# Patient Record
Sex: Male | Born: 1990 | Race: Black or African American | Hispanic: No | Marital: Single | Smoking: Never smoker
Health system: Southern US, Community
[De-identification: ages and names within clinical notes are randomized; demographics above are authoritative.]

## PROBLEM LIST (undated history)

## (undated) DIAGNOSIS — T883XXA Malignant hyperthermia due to anesthesia, initial encounter: Secondary | ICD-10-CM

## (undated) HISTORY — PX: HUMERUS FRACTURE SURGERY: SHX670

---
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
## 2012-03-16 DIAGNOSIS — T883XXA Malignant hyperthermia due to anesthesia, initial encounter: Secondary | ICD-10-CM

## 2012-03-16 HISTORY — PX: HUMERUS FRACTURE SURGERY: SHX670

## 2012-03-16 HISTORY — DX: Malignant hyperthermia due to anesthesia, initial encounter: T88.3XXA

## 2013-02-07 ENCOUNTER — Ambulatory Visit: Payer: Self-pay | Admitting: Family Medicine

## 2014-05-25 ENCOUNTER — Ambulatory Visit: Payer: Self-pay | Admitting: Family Medicine

## 2014-08-21 ENCOUNTER — Encounter: Payer: Self-pay | Admitting: Family Medicine

## 2014-09-05 ENCOUNTER — Ambulatory Visit (INDEPENDENT_AMBULATORY_CARE_PROVIDER_SITE_OTHER): Payer: BLUE CROSS/BLUE SHIELD | Admitting: Family Medicine

## 2014-09-05 ENCOUNTER — Encounter: Payer: Self-pay | Admitting: *Deleted

## 2014-09-05 ENCOUNTER — Encounter: Payer: Self-pay | Admitting: Family Medicine

## 2014-09-05 VITALS — BP 116/70 | HR 84 | Temp 98.3°F | Resp 17 | Ht 74.0 in | Wt 168.8 lb

## 2014-09-05 DIAGNOSIS — Z0000 Encounter for general adult medical examination without abnormal findings: Secondary | ICD-10-CM

## 2014-09-05 DIAGNOSIS — K4090 Unilateral inguinal hernia, without obstruction or gangrene, not specified as recurrent: Secondary | ICD-10-CM

## 2014-09-05 NOTE — Progress Notes (Signed)
Name: Albert Gonzales   MRN: [id number]    DOB: September 07, 1990   Date:09/05/2014       Progress Note  Subjective  Chief Complaint  Chief Complaint  Patient presents with  . Annual Exam    HPI  Patient is here today for a Complete Male Physical Exam:  The patient has no acute concerns other than ongoing left groin and scrotal mass. Overall feels healthy. Diet is well balanced. In general does exercise regularly. Sees dentist regularly and addresses vision concerns with ophthalmologist if applicable. In regards to sexual activity the patient is currently sexually active. Currently is not concerned about exposure to any STDs.    History reviewed. No pertinent past medical history.  Past Surgical History  Procedure Laterality Date  . Mandible surgery Left 2006  . Femur fracture surgery Right 2014    Family History  Problem Relation Age of Onset  . Family history unknown: Yes    History   Social History  . Marital Status: Single    Spouse Name: N/A  . Number of Children: N/A  . Years of Education: N/A   Occupational History  . Not on file.   Social History Main Topics  . Smoking status: Never Smoker   . Smokeless tobacco: Not on file  . Alcohol Use: 0.6 oz/week    1 Cans of beer per week     Comment: Patient states he may drink a beer once a month  . Drug Use: No  . Sexual Activity:    Partners: Female    Pharmacist, hospital Protection: Condom   Other Topics Concern  . Not on file   Social History Narrative  . No narrative on file    No current outpatient prescriptions on file.  No Known Allergies  ROS  CONSTITUTIONAL: No significant weight changes, fever, chills, weakness or fatigue.  HEENT:  - Eyes: No visual changes.  - Ears: No auditory changes. No pain.  - Nose: No sneezing, congestion, runny nose. - Throat: No sore throat. No changes in swallowing. SKIN: No rash or itching.  CARDIOVASCULAR: No chest pain, chest pressure or chest discomfort. No  palpitations or edema.  RESPIRATORY: No shortness of breath, cough or sputum.  GASTROINTESTINAL: No anorexia, nausea, vomiting. No changes in bowel habits. No abdominal pain or blood.  GENITOURINARY: No dysuria. No frequency. No discharge.  NEUROLOGICAL: No headache, dizziness, syncope, paralysis, ataxia, numbness or tingling in the extremities. No memory changes. No change in bowel or bladder control.  MUSCULOSKELETAL: No joint pain. No muscle pain. HEMATOLOGIC: No anemia, bleeding or bruising.  LYMPHATICS: No enlarged lymph nodes.  PSYCHIATRIC: No change in mood. No change in sleep pattern.  ENDOCRINOLOGIC: No reports of sweating, cold or heat intolerance. No polyuria or polydipsia.   Objective  Filed Vitals:   09/05/14 1150  BP: 116/70  Pulse: 84  Temp: 98.3 F (36.8 C)  Resp: 17  Height: 6\' 2"  (1.88 m)  Weight: 168 lb 12.8 oz (76.567 kg)  SpO2: 96%    Depression screen PHQ 2/9 09/05/2014  Decreased Interest 0  Down, Depressed, Hopeless 0  PHQ - 2 Score 0      Physical Exam  Constitutional: Patient appears well-developed and well-nourished. In no distress.  HEENT:  - Head: Normocephalic and atraumatic.  - Ears: Bilateral TMs gray, no erythema or effusion - Nose: Nasal mucosa moist - Mouth/Throat: Oropharynx is clear and moist. No tonsillar hypertrophy or erythema. No post nasal drainage.  - Eyes: Conjunctivae clear,  EOM movements normal. PERRLA. No scleral icterus.  Neck: Normal range of motion. Neck supple. No JVD present. No thyromegaly present.  Cardiovascular: Normal rate, regular rhythm and normal heart sounds.  No murmur heard.  Pulmonary/Chest: Effort normal and breath sounds normal. No respiratory distress. Abdominal: Soft. Bowel sounds are normal, no distension. There is no tenderness. no masses BREAST: Bilateral breast exam normal with no masses, skin changes or nipple discharge MALE GENITALIA: Bilateral testes descended with no penile lesions, no penile  discharge. Left scrotum full with palpable mass that is reducible. Non tender. Musculoskeletal: Normal range of motion bilateral UE and LE, no joint effusions. Peripheral vascular: Bilateral LE no edema. Neurological: CN II-XII grossly intact with no focal deficits. Alert and oriented to person, place, and time. Coordination, balance, strength, speech and gait are normal.  Skin: Skin is warm and dry. No rash noted. No erythema.  Psychiatric: Patient has a normal mood and affect. Behavior is normal in office today. Judgment and thought content normal in office today.    Assessment & Plan  1. Annual physical exam Health young male. Going to school, Physics major. Good extra carricular activities and exercise. Working as well. He has declined any concerns for STDs or routine blood work.  2. Direct inguinal hernia Clinically suspicious findings of left inguinal hernia without torsion or strangulation. Will have him consult with Gen Surg.  - Ambulatory referral to General Surgery

## 2014-09-05 NOTE — Patient Instructions (Signed)

## 2014-09-19 ENCOUNTER — Ambulatory Visit (INDEPENDENT_AMBULATORY_CARE_PROVIDER_SITE_OTHER): Payer: BLUE CROSS/BLUE SHIELD | Admitting: General Surgery

## 2014-09-19 ENCOUNTER — Encounter: Payer: Self-pay | Admitting: General Surgery

## 2014-09-19 VITALS — BP 108/60 | HR 71 | Resp 13 | Ht 74.0 in | Wt 166.0 lb

## 2014-09-19 DIAGNOSIS — K4090 Unilateral inguinal hernia, without obstruction or gangrene, not specified as recurrent: Secondary | ICD-10-CM

## 2014-09-19 NOTE — Progress Notes (Signed)
Patient ID: Albert Gonzales, male   DOB: Nov 21, 1990, 24 y.o.   MRN: [id number]  Chief Complaint  Patient presents with  . Inguinal Hernia    HPI Albert Gonzales is a 24 y.o. male.  Here today for evaluation of possible inguinal hernia. He states that he developed a knot in his left lower abdomen about 2 years ago. He reports that the area seemed to move lower and is now in his lower left groin area and he does feel a mass in his scrotum. He reports that the pain comes and goes and occurs about twice a week. He reports that he pain has increased in the last year. He reports no problems with using the bathroom. No history of hernia.    HPI  Past Medical History  Diagnosis Date  . Hypothermia following anesthesia     malignant hypothermia    Past Surgical History  Procedure Laterality Date  . Mandible surgery Left 2006  . Femur fracture surgery Right 2014    Family History  Problem Relation Age of Onset  . Family history unknown: Yes    Social History History  Substance Use Topics  . Smoking status: Never Smoker   . Smokeless tobacco: Never Used  . Alcohol Use: 0.6 oz/week    1 Cans of beer per week     Comment: Patient states he may drink a beer once a month    No Known Allergies  No current outpatient prescriptions on file.   No current facility-administered medications for this visit.    Review of Systems Review of Systems  Constitutional: Negative.   Respiratory: Negative.   Cardiovascular: Negative.   Gastrointestinal: Negative.     Blood pressure 108/60, pulse 71, resp. rate 13, height 6\' 2"  (1.88 m), weight 166 lb (75.297 kg).  Physical Exam Physical Exam  Constitutional: He is oriented to person, place, and time. He appears well-developed and well-nourished.  Eyes: Conjunctivae are normal. No scleral icterus.  Neck: Neck supple.  Cardiovascular: Normal rate, regular rhythm and normal heart sounds.   Pulmonary/Chest: Effort normal and  breath sounds normal.  Abdominal: Soft. Normal appearance. There is no tenderness. A hernia is present. Hernia confirmed positive in the left inguinal area (reducible).  Genitourinary: Left testis shows swelling.  Lymphadenopathy:    He has no cervical adenopathy.  Neurological: He is alert and oriented to person, place, and time.  Skin: Skin is warm and dry.  Psychiatric: He has a normal mood and affect.  Hernis descends into scrotum.  Data Reviewed    Assessment    Left inguinal hernia, reducible     Plan    Hernia precautions and incarceration were discussed with the patient. If they develop symptoms of an incarcerated hernia, they were encouraged to seek prompt medical attention.  I have recommended repair of the hernia using mesh on an outpatient basis in the near future. The risk of infection was reviewed. The role of prosthetic mesh to minimize the risk of recurrence was reviewed. Patient agreeable.    Patient[facility].     Follow up tba PCP:  Enid BaasSundaram, Ashany  Kennedy, Caryl-Lyn M 09/19/2014, 4:06 PM

## 2014-09-19 NOTE — Patient Instructions (Addendum)
The patient is aware to call back for any questions or concerns.Inguinal Hernia, Adult Muscles help keep everything in the body in its proper place. But if a weak spot in the muscles develops, something can poke through. That is called a hernia. When this happens in the lower part of the belly (abdomen), it is called an inguinal hernia. (It takes its name from a part of the body in this region called the inguinal canal.) A weak spot in the wall of muscles lets some fat or part of the small intestine bulge through. An inguinal hernia can develop at any age. Men get them more often than women. CAUSES  In adults, an inguinal hernia develops over time.  It can be triggered by:  Suddenly straining the muscles of the lower abdomen.  Lifting heavy objects.  Straining to have a bowel movement. Difficult bowel movements (constipation) can lead to this.  Constant coughing. This may be caused by smoking or lung disease.  Being overweight.  Being pregnant.  Working at a job that requires long periods of standing or heavy lifting.  Having had an inguinal hernia before. One type can be an emergency situation. It is called a strangulated inguinal hernia. It develops if part of the small intestine slips through the weak spot and cannot get back into the abdomen. The blood supply can be cut off. If that happens, part of the intestine may die. This situation requires emergency surgery. SYMPTOMS  Often, a small inguinal hernia has no symptoms. It is found when a healthcare provider does a physical exam. Larger hernias usually have symptoms.   In adults, symptoms may include:  A lump in the groin. This is easier to see when the person is standing. It might disappear when lying down.  In men, a lump in the scrotum.  Pain or burning in the groin. This occurs especially when lifting, straining or coughing.  A dull ache or feeling of pressure in the groin.  Signs of a strangulated hernia can  include:  A bulge in the groin that becomes very painful and tender to the touch.  A bulge that turns red or purple.  Fever, nausea and vomiting.  Inability to have a bowel movement or to pass gas. DIAGNOSIS  To decide if you have an inguinal hernia, a healthcare provider will probably do a physical examination.  This will include asking questions about any symptoms you have noticed.  The healthcare provider might feel the groin area and ask you to cough. If an inguinal hernia is felt, the healthcare provider may try to slide it back into the abdomen.  Usually no other tests are needed. TREATMENT  Treatments can vary. The size of the hernia makes a difference. Options include:  Watchful waiting. This is often suggested if the hernia is small and you have had no symptoms.  No medical procedure will be done unless symptoms develop.  You will need to watch closely for symptoms. If any occur, contact your healthcare provider right away.  Surgery. This is used if the hernia is larger or you have symptoms.  Open surgery. This is usually an outpatient procedure (you will not stay overnight in a hospital). An cut (incision) is made through the skin in the groin. The hernia is put back inside the abdomen. The weak area in the muscles is then repaired by herniorrhaphy or hernioplasty. Herniorrhaphy: in this type of surgery, the weak muscles are sewn back together. Hernioplasty: a patch or mesh is used  to close the weak area in the abdominal wall.  Laparoscopy. In this procedure, a surgeon makes small incisions. A thin tube with a tiny video camera (called a laparoscope) is put into the abdomen. The surgeon repairs the hernia with mesh by looking with the video camera and using two long instruments. HOME CARE INSTRUCTIONS   After surgery to repair an inguinal hernia:  You will need to take pain medicine prescribed by your healthcare provider. Follow all directions carefully.  You will need  to take care of the wound from the incision.  Your activity will be restricted for awhile. This will probably include no heavy lifting for several weeks. You also should not do anything too active for a few weeks. When you can return to work will depend on the type of job that you have.  During "watchful waiting" periods, you should:  Maintain a healthy weight.  Eat a diet high in fiber (fruits, vegetables and whole grains).  Drink plenty of fluids to avoid constipation. This means drinking enough water and other liquids to keep your urine clear or pale yellow.  Do not lift heavy objects.  Do not stand for long periods of time.  Quit smoking. This should keep you from developing a frequent cough. SEEK MEDICAL CARE IF:   A bulge develops in your groin area.  You feel pain, a burning sensation or pressure in the groin. This might be worse if you are lifting or straining.  You develop a fever of more than 100.5 F (38.1 C). SEEK IMMEDIATE MEDICAL CARE IF:   Pain in the groin increases suddenly.  A bulge in the groin gets bigger suddenly and does not go down.  For men, there is sudden pain in the scrotum. Or, the size of the scrotum increases.  A bulge in the groin area becomes red or purple and is painful to touch.  You have nausea or vomiting that does not go away.  You feel your heart beating much faster than normal.  You cannot have a bowel movement or pass gas.  You develop a fever of more than 102.0 F (38.9 C). Document Released: 07/19/2008 Document Revised: 05/25/2011 Document Reviewed: 07/19/2008 [facility] Patient Information 2015 [place], [place]. This information is not intended to replace advice given to you by your health care provider. Make sure you discuss any questions you have with your health care provider.  Patient's surgery has been scheduled for 09-26-14 at [facility].

## 2014-09-19 NOTE — Addendum Note (Signed)
Addended by: Kieth BrightlySANKAR, Shoni Quijas G on: 09/19/2014 06:19 PM   Modules accepted: Orders

## 2014-09-20 ENCOUNTER — Encounter: Payer: Self-pay | Admitting: General Surgery

## 2014-09-24 ENCOUNTER — Inpatient Hospital Stay: Admission: RE | Admit: 2014-09-24 | Payer: Self-pay | Source: Ambulatory Visit

## 2014-09-25 ENCOUNTER — Encounter: Payer: Self-pay | Admitting: *Deleted

## 2014-09-25 DIAGNOSIS — K4090 Unilateral inguinal hernia, without obstruction or gangrene, not specified as recurrent: Secondary | ICD-10-CM | POA: Diagnosis not present

## 2014-09-25 DIAGNOSIS — Z87898 Personal history of other specified conditions: Secondary | ICD-10-CM | POA: Diagnosis not present

## 2014-09-25 MED ORDER — CEFAZOLIN SODIUM-DEXTROSE 2-3 GM-% IV SOLR
2.0000 g | INTRAVENOUS | Status: AC
  Administered 2014-09-26: 2 g via INTRAVENOUS

## 2014-09-25 NOTE — Patient Instructions (Signed)
  Your procedure is scheduled on: 09-26-14 Report to [facility] 2ND FLOOR To find out your arrival time please call [phone] between 1PM - 3PM on 09-25-14   Remember: Instructions that are not followed completely may result in serious medical risk, up to and including death, or upon the discretion of your surgeon and anesthesiologist your surgery may need to be rescheduled.    _X___ 1. Do not eat food or drink liquids after midnight. No gum chewing or hard candies.     _X___ 2. No Alcohol for 24 hours before or after surgery.   ____ 3. Bring all medications with you on the day of surgery if instructed.    _X___ 4. Notify your doctor if there is any change in your medical condition     (cold, fever, infections).     Do not wear jewelry, make-up, hairpins, clips or nail polish.  Do not wear lotions, powders, or perfumes. You may wear deodorant.  Do not shave 48 hours prior to surgery. Men may shave face and neck.  Do not bring valuables to the hospital.    [facility] not responsible for any belongings or valuables.               Contacts, dentures or bridgework may not be worn into surgery.  Leave your suitcase in the car. After surgery it may be brought to your room.  For patients admitted to the hospital, discharge time is determined by your  treatment team.   Patients discharged the day of surgery will not be allowed to drive home.   Please read over the following fact sheets that you were given:     ____ Take these medicines the morning of surgery with A SIP OF WATER:    1. NONE  2.   3.   4.  5.  6.  ____ Fleet Enema (as directed)   _X___ Use CHG Soap as directed  ____ Use inhalers on the day of surgery  ____ Stop metformin 2 days prior to surgery    ____ Take 1/2 of usual insulin dose the night before surgery and none on the morning of surgery.   ____ Stop Coumadin/Plavix/aspirin-N/A  ____ Stop Anti-inflammatories-NO NSAIDS OR  ASPIRIN PRODUCTS-TYLENOL OK   ____ Stop supplements until after surgery.    ____ Bring C-Pap to the hospital.

## 2014-09-26 ENCOUNTER — Ambulatory Visit: Payer: BLUE CROSS/BLUE SHIELD | Admitting: Anesthesiology

## 2014-09-26 ENCOUNTER — Encounter: Payer: Self-pay | Admitting: General Surgery

## 2014-09-26 ENCOUNTER — Encounter
Admission: RE | Disposition: A | Discharge status: Home / Self Care | Payer: Self-pay | Source: Ambulatory Visit | Attending: General Surgery

## 2014-09-26 ENCOUNTER — Ambulatory Visit
Admission: RE | Admit: 2014-09-26 | Discharge: 2014-09-26 | Disposition: A | Discharge status: Home / Self Care | Payer: BLUE CROSS/BLUE SHIELD | Source: Ambulatory Visit | Attending: General Surgery | Admitting: General Surgery

## 2014-09-26 DIAGNOSIS — Z87898 Personal history of other specified conditions: Secondary | ICD-10-CM | POA: Insufficient documentation

## 2014-09-26 DIAGNOSIS — K4090 Unilateral inguinal hernia, without obstruction or gangrene, not specified as recurrent: Secondary | ICD-10-CM

## 2014-09-26 HISTORY — PX: INGUINAL HERNIA REPAIR: SHX194

## 2014-09-26 HISTORY — DX: Malignant hyperthermia due to anesthesia, initial encounter: T88.3XXA

## 2014-09-26 SURGERY — REPAIR, HERNIA, INGUINAL, ADULT
Anesthesia: General | Laterality: Left | Wound class: Clean

## 2014-09-26 MED ORDER — FAMOTIDINE 20 MG PO TABS
ORAL_TABLET | ORAL | Status: AC
  Filled 2014-09-26: qty 1

## 2014-09-26 MED ORDER — LIDOCAINE HCL (PF) 1 % IJ SOLN
INTRAMUSCULAR | Status: AC
  Filled 2014-09-26: qty 30

## 2014-09-26 MED ORDER — MIDAZOLAM HCL 2 MG/2ML IJ SOLN
INTRAMUSCULAR | Status: DC | PRN
  Administered 2014-09-26: 2 mg via INTRAVENOUS

## 2014-09-26 MED ORDER — OXYCODONE-ACETAMINOPHEN 5-325 MG PO TABS
ORAL_TABLET | ORAL | Status: AC
  Filled 2014-09-26: qty 1

## 2014-09-26 MED ORDER — PROPOFOL INFUSION 10 MG/ML OPTIME
INTRAVENOUS | Status: DC | PRN
  Administered 2014-09-26: 100 ug/kg/min via INTRAVENOUS

## 2014-09-26 MED ORDER — ACETAMINOPHEN 10 MG/ML IV SOLN
INTRAVENOUS | Status: AC
  Filled 2014-09-26: qty 100

## 2014-09-26 MED ORDER — OXYCODONE-ACETAMINOPHEN 5-325 MG PO TABS: 1.0000 | ORAL_TABLET | ORAL | Status: DC | PRN

## 2014-09-26 MED ORDER — ONDANSETRON HCL 4 MG/2ML IJ SOLN
INTRAMUSCULAR | Status: DC | PRN
  Administered 2014-09-26: 4 mg via INTRAVENOUS

## 2014-09-26 MED ORDER — ONDANSETRON HCL 4 MG/2ML IJ SOLN
4.0000 mg | Freq: Once | INTRAMUSCULAR | Status: DC | PRN
Start: 2014-09-26 — End: 2014-09-26

## 2014-09-26 MED ORDER — CEFAZOLIN SODIUM-DEXTROSE 2-3 GM-% IV SOLR
INTRAVENOUS | Status: AC
  Administered 2014-09-26: 2 g via INTRAVENOUS
  Filled 2014-09-26: qty 50

## 2014-09-26 MED ORDER — PROPOFOL 10 MG/ML IV BOLUS
INTRAVENOUS | Status: DC | PRN
  Administered 2014-09-26 (×2): 20 mg via INTRAVENOUS

## 2014-09-26 MED ORDER — FENTANYL CITRATE (PF) 100 MCG/2ML IJ SOLN
INTRAMUSCULAR | Status: DC | PRN
  Administered 2014-09-26: 50 ug via INTRAVENOUS

## 2014-09-26 MED ORDER — DEXAMETHASONE SODIUM PHOSPHATE 10 MG/ML IJ SOLN
INTRAMUSCULAR | Status: DC | PRN
  Administered 2014-09-26: 10 mg via INTRAVENOUS

## 2014-09-26 MED ORDER — FENTANYL CITRATE (PF) 100 MCG/2ML IJ SOLN
INTRAMUSCULAR | Status: AC
  Filled 2014-09-26: qty 2

## 2014-09-26 MED ORDER — CHLORHEXIDINE GLUCONATE 4 % EX LIQD: 1.0000 "application " | Freq: Once | CUTANEOUS | Status: DC

## 2014-09-26 MED ORDER — ACETAMINOPHEN 10 MG/ML IV SOLN
INTRAVENOUS | Status: DC | PRN
  Administered 2014-09-26: 1000 mg via INTRAVENOUS

## 2014-09-26 MED ORDER — BUPIVACAINE HCL (PF) 0.5 % IJ SOLN
INTRAMUSCULAR | Status: AC
  Filled 2014-09-26: qty 30

## 2014-09-26 MED ORDER — BUPIVACAINE HCL 0.5 % IJ SOLN
INTRAMUSCULAR | Status: DC | PRN
  Administered 2014-09-26: 20 mL via SUBCUTANEOUS

## 2014-09-26 MED ORDER — HYDROMORPHONE HCL 1 MG/ML IJ SOLN: 0.2500 mg | INTRAMUSCULAR | Status: DC | PRN

## 2014-09-26 MED ORDER — LACTATED RINGERS IV SOLN
INTRAVENOUS | Status: DC
  Administered 2014-09-26: 11:00:00 via INTRAVENOUS

## 2014-09-26 MED ORDER — FAMOTIDINE 20 MG PO TABS
20.0000 mg | ORAL_TABLET | Freq: Once | ORAL | Status: AC
  Administered 2014-09-26: 20 mg via ORAL

## 2014-09-26 MED ORDER — CEFAZOLIN SODIUM-DEXTROSE 2-3 GM-% IV SOLR
INTRAVENOUS | Status: DC | PRN
  Administered 2014-09-26: 2 g via INTRAVENOUS

## 2014-09-26 MED ORDER — FENTANYL CITRATE (PF) 100 MCG/2ML IJ SOLN
25.0000 ug | INTRAMUSCULAR | Status: DC | PRN
  Administered 2014-09-26 (×4): 25 ug via INTRAVENOUS

## 2014-09-26 SURGICAL SUPPLY — 28 items
BLADE SURG 15 STRL SS SAFETY (BLADE) ×3 IMPLANT
CANISTER SUCT 1200ML W/VALVE (MISCELLANEOUS) ×3 IMPLANT
CHLORAPREP W/TINT 26ML (MISCELLANEOUS) ×3 IMPLANT
DECANTER SPIKE VIAL GLASS SM (MISCELLANEOUS) ×6 IMPLANT
DRAIN PENROSE 1/4X12 LTX (DRAIN) ×3 IMPLANT
DRAPE LAPAROTOMY 100X77 ABD (DRAPES) ×3 IMPLANT
GLOVE BIO SURGEON STRL SZ7 (GLOVE) ×15 IMPLANT
GOWN STRL REUS W/ TWL LRG LVL3 (GOWN DISPOSABLE) ×3 IMPLANT
GOWN STRL REUS W/TWL LRG LVL3 (GOWN DISPOSABLE) ×6
KIT RM TURNOVER STRD PROC AR (KITS) ×3 IMPLANT
LABEL OR SOLS (LABEL) ×3 IMPLANT
LIQUID BAND (GAUZE/BANDAGES/DRESSINGS) ×3 IMPLANT
MESH PARIETEX PROGRIP LEFT (Mesh General) ×3 IMPLANT
NDL HPO THNWL 1X22GA REG BVL (NEEDLE) IMPLANT
NDL SAFETY 25GX1.5 (NEEDLE) ×3 IMPLANT
NEEDLE SAFETY 22GX1 (NEEDLE)
NS IRRIG 500ML POUR BTL (IV SOLUTION) ×3 IMPLANT
PACK BASIN MINOR ARMC (MISCELLANEOUS) ×3 IMPLANT
PAD GROUND ADULT SPLIT (MISCELLANEOUS) ×3 IMPLANT
SUT PDS 2-0 27IN (SUTURE) ×3 IMPLANT
SUT VIC AB 2-0 SH 27 (SUTURE) ×2
SUT VIC AB 2-0 SH 27XBRD (SUTURE) ×1 IMPLANT
SUT VIC AB 3-0 54X BRD REEL (SUTURE) ×1 IMPLANT
SUT VIC AB 3-0 BRD 54 (SUTURE) ×2
SUT VIC AB 3-0 SH 27 (SUTURE) ×2
SUT VIC AB 3-0 SH 27X BRD (SUTURE) ×1 IMPLANT
SUT VIC AB 4-0 FS2 27 (SUTURE) ×3 IMPLANT
SYR CONTROL 10ML (SYRINGE) ×3 IMPLANT

## 2014-09-26 NOTE — Anesthesia Postprocedure Evaluation (Signed)
  Anesthesia Post-op Note  Patient: Albert Gonzales  Procedure(s) Performed: Procedure(s): LEFT INGUINAL HERNIA REPAIR  (Left)  Anesthesia type:General  Patient location: PACU  Post pain: Pain level controlled  Post assessment: Post-op Vital signs reviewed, Patient's Cardiovascular Status Stable, Respiratory Function Stable, Patent Airway and No signs of Nausea or vomiting  Post vital signs: Reviewed and stable  Last Vitals:  Filed Vitals:   09/26/14 1344  BP: 125/76  Pulse: 55  Temp: 36.4 C  Resp: 16    Level of consciousness: awake, alert  and patient cooperative  Complications: No apparent anesthesia complications

## 2014-09-26 NOTE — Interval H&P Note (Signed)
History and Physical Interval Note:  09/26/2014 11:17 AM  Albert Gonzales  has presented today for surgery, with the diagnosis of LIH  The various methods of treatment have been discussed with the patient and family. After consideration of risks, benefits and other options for treatment, the patient has consented to  Procedure(s): HERNIA REPAIR INGUINAL ADULT (Left) as a surgical intervention .  The patient's history has been reviewed, patient examined, no change in status, stable for surgery.  I have reviewed the patient's chart and labs.  Questions were answered to the patient's satisfaction.     Vear Staton G

## 2014-09-26 NOTE — Anesthesia Procedure Notes (Signed)
Date/Time: 09/26/2014 11:41 AM Performed by: Henrietta HooverPOPE, Ludell Zacarias Pre-anesthesia Checklist: Patient identified, Emergency Drugs available, Suction available, Patient being monitored and Timeout performed Patient Re-evaluated:Patient Re-evaluated prior to inductionOxygen Delivery Method: Simple face mask Intubation Type: IV induction Placement Confirmation: positive ETCO2

## 2014-09-26 NOTE — H&P (View-Only) (Signed)
Patient ID: Albert Gonzales, male   DOB: 24-Dec-1990, 24 y.o.   MRN: [id number]  Chief Complaint  Patient presents with  . Inguinal Hernia    HPI Albert Gonzales is a 24 y.o. male.  Here today for evaluation of possible inguinal hernia. He states that he developed a knot in his left lower abdomen about 2 years ago. He reports that the area seemed to move lower and is now in his lower left groin area and he does feel a mass in his scrotum. He reports that the pain comes and goes and occurs about twice a week. He reports that he pain has increased in the last year. He reports no problems with using the bathroom. No history of hernia.    HPI  Past Medical History  Diagnosis Date  . Hypothermia following anesthesia     malignant hypothermia    Past Surgical History  Procedure Laterality Date  . Mandible surgery Left 2006  . Femur fracture surgery Right 2014    Family History  Problem Relation Age of Onset  . Family history unknown: Yes    Social History History  Substance Use Topics  . Smoking status: Never Smoker   . Smokeless tobacco: Never Used  . Alcohol Use: 0.6 oz/week    1 Cans of beer per week     Comment: Patient states he may drink a beer once a month    No Known Allergies  No current outpatient prescriptions on file.   No current facility-administered medications for this visit.    Review of Systems Review of Systems  Constitutional: Negative.   Respiratory: Negative.   Cardiovascular: Negative.   Gastrointestinal: Negative.     Blood pressure 108/60, pulse 71, resp. rate 13, height 6\' 2"  (1.88 Gonzales), weight 166 lb (75.297 kg).  Physical Exam Physical Exam  Constitutional: He is oriented to person, place, and time. He appears well-developed and well-nourished.  Eyes: Conjunctivae are normal. No scleral icterus.  Neck: Neck supple.  Cardiovascular: Normal rate, regular rhythm and normal heart sounds.   Pulmonary/Chest: Effort normal and  breath sounds normal.  Abdominal: Soft. Normal appearance. There is no tenderness. A hernia is present. Hernia confirmed positive in the left inguinal area (reducible).  Genitourinary: Left testis shows swelling.  Lymphadenopathy:    He has no cervical adenopathy.  Neurological: He is alert and oriented to person, place, and time.  Skin: Skin is warm and dry.  Psychiatric: He has a normal mood and affect.  Hernis descends into scrotum.  Data Reviewed    Assessment    Left inguinal hernia, reducible     Plan    Hernia precautions and incarceration were discussed with the patient. If they develop symptoms of an incarcerated hernia, they were encouraged to seek prompt medical attention.  I have recommended repair of the hernia using mesh on an outpatient basis in the near future. The risk of infection was reviewed. The role of prosthetic mesh to minimize the risk of recurrence was reviewed. Patient agreeable.    Patient's [facility]26[facility].     Follow up tba PCP:  Albert Gonzales, Albert  Gonzales, Albert Gonzales 09/19/2014, 4:06 PM

## 2014-09-26 NOTE — Anesthesia Preprocedure Evaluation (Addendum)
Anesthesia Evaluation  Patient identified by MRN, date of birth, ID band Patient awake    Reviewed: Allergy & Precautions, NPO status , Patient's Chart, lab work & pertinent test results  History of Anesthesia Complications (+) MALIGNANT HYPERTHERMIA and history of anesthetic complications  Airway Mallampati: II  TM Distance: >3 FB Neck ROM: Full    Dental no notable dental hx.    Pulmonary neg pulmonary ROS,  breath sounds clear to auscultation  Pulmonary exam normal       Cardiovascular Exercise Tolerance: Good negative cardio ROS Normal cardiovascular examRhythm:Regular Rate:Normal     Neuro/Psych negative neurological ROS  negative psych ROS   GI/Hepatic negative GI ROS, Neg liver ROS,   Endo/Other  negative endocrine ROS  Renal/GU negative Renal ROS  negative genitourinary   Musculoskeletal negative musculoskeletal ROS (+)   Abdominal   Peds negative pediatric ROS (+)  Hematology negative hematology ROS (+)   Anesthesia Other Findings   Reproductive/Obstetrics negative OB ROS                             Anesthesia Physical Anesthesia Plan  ASA: II  Anesthesia Plan: General   Post-op Pain Management:    Induction: Intravenous  Airway Management Planned: Nasal Cannula and Simple Face Mask  Additional Equipment:   Intra-op Plan:   Post-operative Plan:   Informed Consent: I have reviewed the patients History and Physical, chart, labs and discussed the procedure including the risks, benefits and alternatives for the proposed anesthesia with the patient or authorized representative who has indicated his/her understanding and acceptance.   Dental advisory given  Plan Discussed with: CRNA and Surgeon  Anesthesia Plan Comments:         Anesthesia Quick Evaluation

## 2014-09-26 NOTE — Op Note (Signed)
Preop diagnosis: Left inguinal hernia   Post op diagnosis: Same  Operation: Repair left inguinal hernia with mesh  Surgeon: Timoteo ExposeS.G .Ainsleigh Kakos  Assistant:     Anesthesia: Gen.  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 Accordingly the patient was adequately sedated and timeout was performed. Left groin was prepped and draped sterile field. Local anesthetic containing 0.5%t Marcaine mixed with 1% Xylocaine was instilled. Left inguinal region was opened with a incision along the medial two thirds and deepened through the layers down to the external oblique. In the inguinal canal was infiltrated with local anesthetic and then opened along the line of the external oblique fibers. The cord was dissected off the posterior wall and then opened to reveal a very long and contained sac adherent to the surrounding cord structures. The sac was freed from the surrounding cord structures carefully and dissected down to the internal ring area and a little bit hiogher to allow for high ligation. The sac was opened and no contents were noted. Under direct vision suture ligature was placed of 2-0 Vicryl at the base of the sac and amputated. The suture was used to transfix the stump to the undersurface of the internal oblique fibers. Posterior wall was then reinforced by placing a prior takes Prograf mesh lay down from around the cord securely. Medial and was tacked to the pubic tubercle with 2 stitches of 2-0 PDS. Wound was irrigated and closed. External oblique was closed with running 2-0 PDS. Subcutaneous tissue closed with running 3-0 Vicryl. Skin was closed with subcuticular 4-0 Vicryl. Liqui  ban was applied. No immediate problems noted from the procedure. Patient subsequently returned recovery room in  stable condition.

## 2014-09-26 NOTE — Transfer of Care (Signed)
Immediate Anesthesia Transfer of Care Note  Patient: Albert Gonzales  Procedure(s) Performed: Procedure(s): LEFT INGUINAL HERNIA REPAIR  (Left)  Patient Location: PACU  Anesthesia Type:General  Level of Consciousness: awake  Airway & Oxygen Therapy: Patient Spontanous Breathing and Patient connected to face mask oxygen  Post-op Assessment: Report given to RN and Post -op Vital signs reviewed and stable  Post vital signs: Reviewed and stable  Last Vitals: 97.7 Filed Vitals:   09/26/14 1250  BP: 115/73  Pulse: 56  Temp:   Resp: 15    Complications: No apparent anesthesia complications

## 2014-10-03 ENCOUNTER — Ambulatory Visit: Payer: BLUE CROSS/BLUE SHIELD | Admitting: General Surgery

## 2015-06-03 ENCOUNTER — Encounter: Payer: Self-pay | Admitting: Emergency Medicine

## 2015-06-03 ENCOUNTER — Emergency Department
Admission: EM | Admit: 2015-06-03 | Discharge: 2015-06-03 | Disposition: A | Discharge status: Home / Self Care | Payer: Self-pay | Attending: Emergency Medicine | Admitting: Emergency Medicine

## 2015-06-03 ENCOUNTER — Emergency Department: Payer: Self-pay

## 2015-06-03 DIAGNOSIS — R197 Diarrhea, unspecified: Secondary | ICD-10-CM | POA: Insufficient documentation

## 2015-06-03 DIAGNOSIS — R1032 Left lower quadrant pain: Secondary | ICD-10-CM | POA: Insufficient documentation

## 2015-06-03 DIAGNOSIS — R109 Unspecified abdominal pain: Secondary | ICD-10-CM

## 2015-06-03 LAB — URINALYSIS COMPLETE WITH MICROSCOPIC (ARMC ONLY)
BILIRUBIN URINE: NEGATIVE
Bacteria, UA: NONE SEEN
Glucose, UA: NEGATIVE mg/dL
Hgb urine dipstick: NEGATIVE
Leukocytes, UA: NEGATIVE
Nitrite: NEGATIVE
PROTEIN: 30 mg/dL — AB
Specific Gravity, Urine: 1.03 (ref 1.005–1.030)
pH: 7 (ref 5.0–8.0)

## 2015-06-03 LAB — CBC
HCT: 47.1 % (ref 40.0–52.0)
Hemoglobin: 16.1 g/dL (ref 13.0–18.0)
MCH: 29.6 pg (ref 26.0–34.0)
MCHC: 34.1 g/dL (ref 32.0–36.0)
MCV: 86.8 fL (ref 80.0–100.0)
PLATELETS: 217 10*3/uL (ref 150–440)
RBC: 5.43 MIL/uL (ref 4.40–5.90)
RDW: 13.9 % (ref 11.5–14.5)
WBC: 11.6 10*3/uL — ABNORMAL HIGH (ref 3.8–10.6)

## 2015-06-03 LAB — COMPREHENSIVE METABOLIC PANEL
ALBUMIN: 4.8 g/dL (ref 3.5–5.0)
ALK PHOS: 54 U/L (ref 38–126)
ALT: 26 U/L (ref 17–63)
AST: 32 U/L (ref 15–41)
Anion gap: 10 (ref 5–15)
BUN: 18 mg/dL (ref 6–20)
CALCIUM: 9.7 mg/dL (ref 8.9–10.3)
CO2: 21 mmol/L — ABNORMAL LOW (ref 22–32)
CREATININE: 1.22 mg/dL (ref 0.61–1.24)
Chloride: 105 mmol/L (ref 101–111)
GFR calc Af Amer: >60 mL/min (ref >60)
GFR calc non Af Amer: >60 mL/min (ref >60)
GLUCOSE: 122 mg/dL — AB (ref 65–99)
Potassium: 3.6 mmol/L (ref 3.5–5.1)
Sodium: 136 mmol/L (ref 135–145)
Total Bilirubin: 0.9 mg/dL (ref 0.3–1.2)
Total Protein: 8.1 g/dL (ref 6.5–8.1)

## 2015-06-03 LAB — LIPASE, BLOOD: LIPASE: 15 U/L (ref 11–51)

## 2015-06-03 MED ORDER — ACETAMINOPHEN 500 MG PO TABS
1000.0000 mg | ORAL_TABLET | Freq: Once | ORAL | Status: AC
  Administered 2015-06-03: 1000 mg via ORAL

## 2015-06-03 MED ORDER — SODIUM CHLORIDE 0.9 % IV BOLUS (SEPSIS)
1000.0000 mL | Freq: Once | INTRAVENOUS | Status: AC
  Administered 2015-06-03: 1000 mL via INTRAVENOUS

## 2015-06-03 MED ORDER — ONDANSETRON 4 MG PO TBDP
ORAL_TABLET | ORAL | Status: AC
  Administered 2015-06-03: 4 mg
  Filled 2015-06-03: qty 1

## 2015-06-03 MED ORDER — DICYCLOMINE HCL 20 MG PO TABS: 20.0000 mg | ORAL_TABLET | Freq: Three times a day (TID) | ORAL | Status: DC | PRN

## 2015-06-03 MED ORDER — ONDANSETRON HCL 4 MG PO TABS: 4.0000 mg | ORAL_TABLET | Freq: Three times a day (TID) | ORAL | Status: DC | PRN

## 2015-06-03 MED ORDER — ACETAMINOPHEN 500 MG PO TABS
ORAL_TABLET | ORAL | Status: AC
  Filled 2015-06-03: qty 2

## 2015-06-03 MED ORDER — ONDANSETRON HCL 4 MG/2ML IJ SOLN
4.0000 mg | Freq: Once | INTRAMUSCULAR | Status: AC
  Administered 2015-06-03: 4 mg via INTRAVENOUS
  Filled 2015-06-03: qty 2

## 2015-06-03 MED ORDER — HYDROMORPHONE HCL 1 MG/ML IJ SOLN
0.5000 mg | Freq: Once | INTRAMUSCULAR | Status: AC
  Administered 2015-06-03: 0.5 mg via INTRAVENOUS
  Filled 2015-06-03: qty 1

## 2015-06-03 NOTE — ED Provider Notes (Signed)
Time Seen: Approximately 1415  I have reviewed the triage notes  Chief Complaint: Emesis; Diarrhea; and Nausea   History of Present Illness: Albert Gonzales is a 25 y.o. male who presents with nausea, vomiting, and loose stool which started this morning. He states he's had a previous history of hernia repair and states she's having some mild discomfort towards the left groin area of the area of his hernia. He states he saw some blood in the stool or urine. Patient's not sure which one. He states he started feeling sick last night after eating at [facility]. He still has persistent nausea with not a lot of hematemesis at this point. He describes blood-tinged emesis. Denies any coffee-ground emesis. Outside of the left lower quadrant pain and no focal discomfort at this time. He is not aware of any fever at home and is currently not on any anticoagulant therapy. Patient denies being on any recent antibiotics and no exposure to C. difficile risk factors. He denies any recent travel   Past Medical History  Diagnosis Date  . Malignant hyperthermia     pt had arm surgery at [facility] in 2014 and was told that he developed malignant hyperthermia during surgery  . 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   Past Surgical History  Procedure Laterality Date  . Mandible surgery Left 2006  . Humerus fracture surgery Right   . Inguinal hernia repair Left 09/26/2014    Procedure: LEFT INGUINAL HERNIA REPAIR ;  Surgeon: Kieth Brightly, MD;  Location: [facility] ORS;  Service: General;  Laterality: Left;    Past Surgical History  Procedure Laterality Date  . Mandible surgery Left 2006  . Humerus fracture surgery Right   . Inguinal hernia repair Left 09/26/2014    Procedure: LEFT INGUINAL HERNIA REPAIR ;  Surgeon: Kieth Brightly, MD;  Location: [facility] ORS;  Service: General;  Laterality: Left;     Current Outpatient Rx  Name  Route  Sig  Dispense  Refill  . oxyCODONE-acetaminophen (ROXICET) 5-325 MG per tablet   Oral   Take 1 tablet by mouth every 4 (four) hours as needed.   30 tablet   0     Allergies:  Review of patient's allergies indicates no known allergies.  Family History: Family History  Problem Relation Age of Onset  . Family history unknown: Yes    Social History: Social History  Substance Use Topics  . Smoking status: Never Smoker   . Smokeless tobacco: Never Used  . Alcohol Use: 0.6 oz/week    1 Cans of beer per week     Comment: Patient states he may drink a beer once a month     Review of Systems:   10 point review of systems was performed and was otherwise negative:  Constitutional: No fever Eyes: No visual disturbances ENT: No sore throat, ear pain Cardiac: No chest pain Respiratory: No shortness of breath, wheezing, or stridor Abdomen: Abdominal pain described above primarily left lower quadrant without radiation to the back or flank area. He denies any testicular pain, penile discharge or drainage. Endocrine: No weight loss, No night sweats Extremities: No peripheral edema, cyanosis Skin: No rashes, easy bruising Neurologic: No focal weakness, trouble with speech or swollowing Urologic: No dysuria, Hematuria, or urinary frequency   Physical Exam:  ED Triage Vitals  Enc Vitals Group  BP 06/03/15 1135 137/15 mmHg     Pulse Rate 06/03/15 1135 97     Resp 06/03/15 1135 22     Temp 06/03/15 1135 98.5 F (36.9 C)     Temp Source 06/03/15 1135 Oral     SpO2 06/03/15 1135 99 %     Weight 06/03/15 1135 170 lb (77.111 kg)     Height 06/03/15 1135  (1.88 m)     Head Cir --      Peak Flow --      Pain Score 06/03/15 1135 6     Pain Loc --      Pain Edu? --      Excl. in GC? --     General: Awake , Alert , and Oriented times 3; GCS 15 Head: Normal cephalic , atraumatic Eyes: Pupils equal , round, reactive to  light Nose/Throat: No nasal drainage, patent upper airway without erythema or exudate.  Neck: Supple, Full range of motion, No anterior adenopathy or palpable thyroid masses Lungs: Clear to ascultation without wheezes , rhonchi, or rales Heart: Regular rate, regular rhythm without murmurs , gallops , or rubs Abdomen: Soft, non tender without rebound, guarding , or rigidity; bowel sounds positive and symmetric in all 4 quadrants. No organomegaly .        Extremities: 2 plus symmetric pulses. No edema, clubbing or cyanosis Neurologic: normal ambulation, Motor symmetric without deficits, sensory intact Skin: warm, dry, no rashes   Labs:   All laboratory work was reviewed including any pertinent negatives or positives listed below:  Labs Reviewed  COMPREHENSIVE METABOLIC PANEL - Abnormal; Notable for the following:    CO2 21 (*)    Glucose, Bld 122 (*)    All other components within normal limits  CBC - Abnormal; Notable for the following:    WBC 11.6 (*)    All other components within normal limits  GASTROINTESTINAL PANEL BY PCR, STOOL (REPLACES STOOL CULTURE)  LIPASE, BLOOD  URINALYSIS COMPLETEWITH MICROSCOPIC ([facility] ONLY)  Laboratory work was reviewed and showed no clinically significant abnormalities.   Radiology:     CLINICAL DATA: Left flank pain and left lower quadrant pain with nausea and vomiting.  EXAM: CT ABDOMEN AND PELVIS WITHOUT CONTRAST  TECHNIQUE: Multidetector CT imaging of the abdomen and pelvis was performed following the standard protocol without IV contrast.  COMPARISON: None.  FINDINGS: Lower chest: Normal.  Hepatobiliary: Normal.  Pancreas: Normal.  Spleen: Normal.  Adrenals/Urinary Tract: Normal.  Stomach/Bowel: Normal including the terminal ileum and appendix.  Vascular/Lymphatic: Normal.  Reproductive: Suggestion of bilateral varicoceles. Otherwise normal.  Other: No free air or free fluid.  Musculoskeletal: Bilateral pars  defects at L5. Small broad-based bulge of the disc. No spondylolisthesis.  IMPRESSION: No acute abnormality.  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   Electronically Signed By: Francene Boyers M.D. On: 06/03/2015 14:48  I personally reviewed the radiologic studies    ED Course:  Patient was started with a liter of IV fluid was given anti-medic therapy. We requested a stool sample but was several hours of observation the patient was able to produce a stool sample. It was felt that his diarrhea I was obviously improving and he had no persistent nausea or vomiting after treatment. His CAT scan does not show any evidence of renal colic or diverticulitis or a bowel obstruction, etc. Patient most likely given the frequency in the community of viral gastroenteritis though he states his symptoms seemed to develop [facility] [employer] [employer] [facility]. The patient was given a prescription to bring back a stool sample. The patient states he has a global headache at this time without any neck pain or fever. I felt this was unlikely to be meningitis, encephalitis, etc. Patient was given a prescription for Bentyl, Zofran and again not outpatient stool sample, etc.  I felt his discomfort and given its location and characteristics was unlikely to be acute appendicitis or a surgical abdomen.  Assessment: * Acute gastroenteritis Final Clinical Impression:    Plan: * Outpatient management Patient was advised to return immediately if condition worsens. Patient was advised to follow up with their primary care physician or other specialized physicians involved in their outpatient care. The patient and/or family member/power of attorney had laboratory results reviewed at the bedside. All questions and concerns were addressed and appropriate discharge instructions were distributed by the nursing staff.            Jennye MoccasinBrian S Quigley, MD 06/03/15 [phone]

## 2015-06-03 NOTE — Discharge Instructions (Signed)
Abdominal Pain, Adult Many things can cause abdominal pain. Usually, abdominal pain is not caused by a disease and will improve without treatment. It can often be observed and treated at home. Your health care provider will do a physical exam and possibly order blood tests and X-rays to help determine the seriousness of your pain. However, in many cases, more time must pass before a clear cause of the pain can be found. Before that point, your health care provider may not know if you need more testing or further treatment. HOME CARE INSTRUCTIONS Monitor your abdominal pain for any changes. The following actions may help to alleviate any discomfort you are experiencing:  Only take over-the-counter or prescription medicines as directed by your health care provider.  Do not take laxatives unless directed to do so by your health care provider.  Try a clear liquid diet (broth, tea, or water) as directed by your health care provider. Slowly move to a bland diet as tolerated. SEEK MEDICAL CARE IF:  You have unexplained abdominal pain.  You have abdominal pain associated with nausea or diarrhea.  You have pain when you urinate or have a bowel movement.  You experience abdominal pain that wakes you in the night.  You have abdominal pain that is worsened or improved by eating food.  You have abdominal pain that is worsened with eating fatty foods.  You have a fever. SEEK IMMEDIATE MEDICAL CARE IF:  Your pain does not go away within 2 hours.  You keep throwing up (vomiting).  Your pain is felt only in portions of the abdomen, such as the right side or the left lower portion of the abdomen.  You pass bloody or black tarry stools. MAKE SURE YOU:  Understand these instructions.  Will watch your condition.  Will get help right away if you are not doing well or get worse.   This information is not intended to replace advice given to you by your health care provider. Make sure you discuss  any questions you have with your health care provider.   Document Released: 12/10/2004 Document Revised: 11/21/2014 Document Reviewed: 11/09/2012 Elsevier Interactive Patient Education [employer]. Please return immediately if condition worsens. Please contact her primary physician or the physician you were given for referral. If you have any specialist physicians involved in her treatment and plan please also contact them. Thank you for using [place] [facility].  Ie. Please return especially if he have increased blood in your stool, persistent uncontrolled vomiting, focal abdominal pain or any other new concerns.

## 2015-06-03 NOTE — ED Notes (Signed)
Pt states nausea and vomiting that began this AM, states some blood in his vomit, states he had a hernia surgery in July and feels like the pain is coming from the repair, states some blood in his urine, pt awake and alert in no acute distress

## 2015-06-03 NOTE — ED Notes (Signed)
Pt denies any urge to have a BM at this time, informed pt we are still needing a stool sample and urine sample, pt then proceed to go to use restroom without notifying RN, when reminded we need a urine sample he states "I just peed I did not have a bowel movement"

## 2015-06-03 NOTE — ED Notes (Signed)
Pt presents with n/d/v started this am with some abd pain.

## 2016-06-22 ENCOUNTER — Emergency Department: Payer: BLUE CROSS/BLUE SHIELD

## 2016-06-22 ENCOUNTER — Emergency Department
Admission: EM | Admit: 2016-06-22 | Discharge: 2016-06-22 | Disposition: A | Discharge status: Home / Self Care | Payer: BLUE CROSS/BLUE SHIELD | Attending: Emergency Medicine | Admitting: Emergency Medicine

## 2016-06-22 ENCOUNTER — Encounter: Payer: Self-pay | Admitting: Emergency Medicine

## 2016-06-22 DIAGNOSIS — R51 Headache: Secondary | ICD-10-CM | POA: Diagnosis not present

## 2016-06-22 DIAGNOSIS — Z79899 Other long term (current) drug therapy: Secondary | ICD-10-CM | POA: Insufficient documentation

## 2016-06-22 DIAGNOSIS — R519 Headache, unspecified: Secondary | ICD-10-CM

## 2016-06-22 DIAGNOSIS — R112 Nausea with vomiting, unspecified: Secondary | ICD-10-CM | POA: Insufficient documentation

## 2016-06-22 LAB — URINE DRUG SCREEN, QUALITATIVE (ARMC ONLY)
AMPHETAMINES, UR SCREEN: NOT DETECTED
BARBITURATES, UR SCREEN: NOT DETECTED
BENZODIAZEPINE, UR SCRN: NOT DETECTED
Cannabinoid 50 Ng, Ur ~~LOC~~: POSITIVE — AB
Cocaine Metabolite,Ur ~~LOC~~: NOT DETECTED
MDMA (Ecstasy)Ur Screen: NOT DETECTED
METHADONE SCREEN, URINE: NOT DETECTED
Opiate, Ur Screen: NOT DETECTED
PHENCYCLIDINE (PCP) UR S: NOT DETECTED
Tricyclic, Ur Screen: NOT DETECTED

## 2016-06-22 LAB — COMPREHENSIVE METABOLIC PANEL
ALT: 34 U/L (ref 17–63)
ANION GAP: 8 (ref 5–15)
AST: 27 U/L (ref 15–41)
Albumin: 4.4 g/dL (ref 3.5–5.0)
Alkaline Phosphatase: 53 U/L (ref 38–126)
BUN: 12 mg/dL (ref 6–20)
CO2: 24 mmol/L (ref 22–32)
CREATININE: 1.03 mg/dL (ref 0.61–1.24)
Calcium: 9.4 mg/dL (ref 8.9–10.3)
Chloride: 105 mmol/L (ref 101–111)
GFR calc Af Amer: >60 mL/min (ref >60)
GFR calc non Af Amer: >60 mL/min (ref >60)
Glucose, Bld: 90 mg/dL (ref 65–99)
POTASSIUM: 4 mmol/L (ref 3.5–5.1)
SODIUM: 137 mmol/L (ref 135–145)
Total Bilirubin: 0.3 mg/dL (ref 0.3–1.2)
Total Protein: 7.7 g/dL (ref 6.5–8.1)

## 2016-06-22 LAB — URINALYSIS, COMPLETE (UACMP) WITH MICROSCOPIC
BACTERIA UA: NONE SEEN
Bilirubin Urine: NEGATIVE
Glucose, UA: NEGATIVE mg/dL
Hgb urine dipstick: NEGATIVE
Ketones, ur: NEGATIVE mg/dL
Leukocytes, UA: NEGATIVE
Nitrite: NEGATIVE
PROTEIN: NEGATIVE mg/dL
Specific Gravity, Urine: 1.017 (ref 1.005–1.030)
pH: 6 (ref 5.0–8.0)

## 2016-06-22 LAB — CBC
HEMATOCRIT: 44.5 % (ref 40.0–52.0)
Hemoglobin: 14.9 g/dL (ref 13.0–18.0)
MCH: 29.2 pg (ref 26.0–34.0)
MCHC: 33.6 g/dL (ref 32.0–36.0)
MCV: 86.9 fL (ref 80.0–100.0)
Platelets: 248 10*3/uL (ref 150–440)
RBC: 5.12 MIL/uL (ref 4.40–5.90)
RDW: 14 % (ref 11.5–14.5)
WBC: 5.7 10*3/uL (ref 3.8–10.6)

## 2016-06-22 LAB — LIPASE, BLOOD: Lipase: 23 U/L (ref 11–51)

## 2016-06-22 LAB — TROPONIN I

## 2016-06-22 MED ORDER — GADOBENATE DIMEGLUMINE 529 MG/ML IV SOLN
15.0000 mL | Freq: Once | INTRAVENOUS | Status: AC | PRN
  Administered 2016-06-22: 15 mL via INTRAVENOUS
  Filled 2016-06-22: qty 15

## 2016-06-22 NOTE — ED Notes (Signed)
Pt states headaches, nausea and vomiting for 3 weeks, states today at school he "started shaking and felt like my heart was going fast", awake and alert

## 2016-06-22 NOTE — Discharge Instructions (Signed)
You have been seen in the Emergency Department (ED) for a headache. Your evaluation today was overall reassuring. Headaches have many possible causes. Most headaches aren't a sign of a more serious problem, and they will get better on their own.   Follow-up with your doctor in 12-24 hours if you are still having a headache. Otherwise follow up with your doctor in 3-5 days.  For pain take 650 mg of tylenol every 4 hours or 600 mg of ibuprofen every 6 hours  When should you call for help?  Call 911 or return to the ED anytime you think you may need emergency care. For example, call if:  You have signs of a stroke. These may include:  Sudden numbness, paralysis, or weakness in your face, arm, or leg, especially on only one side of your body.  Sudden vision changes.  Sudden trouble speaking.  Sudden confusion or trouble understanding simple statements.  Sudden problems with walking or balance.  A sudden, severe headache that is different from past headaches. You have new or worsening headache Nausea and vomiting associated with your headache Fever, neck stiffness associated with your headache  Call your doctor now or seek immediate medical care if:  You have a new or worse headache.  Your headache gets much worse.  How can you care for yourself at home?  Do not drive if you have taken a prescription pain medicine.  Rest in a quiet, dark room until your headache is gone. Close your eyes and try to relax or go to sleep. Don't watch TV or read.  Put a cold, moist cloth or cold pack on the painful area for 10 to 20 minutes at a time. Put a thin cloth between the cold pack and your skin.  Use a warm, moist towel or a heating pad set on low to relax tight shoulder and neck muscles.  Have someone gently massage your neck and shoulders.  Take pain medicines exactly as directed.  If the doctor gave you a prescription medicine for pain, take it as prescribed.  If you are not taking a prescription  pain medicine, ask your doctor if you can take an over-the-counter medicine. Be careful not to take pain medicine more often than the instructions allow, because you may get worse or more frequent headaches when the medicine wears off.  Do not ignore new symptoms that occur with a headache, such as a fever, weakness or numbness, vision changes, or confusion. These may be signs of a more serious problem.  To prevent headaches  Keep a headache diary so you can figure out what triggers your headaches. Avoiding triggers may help you prevent headaches. Record when each headache began, how long it lasted, and what the pain was like (throbbing, aching, stabbing, or dull). Write down any other symptoms you had with the headache, such as nausea, flashing lights or dark spots, or sensitivity to bright light or loud noise. Note if the headache occurred near your period. List anything that might have triggered the headache, such as certain foods (chocolate, cheese, wine) or odors, smoke, bright light, stress, or lack of sleep.  Find healthy ways to deal with stress. Headaches are most common during or right after stressful times. Take time to relax before and after you do something that has caused a headache in the past.  Try to keep your muscles relaxed by keeping good posture. Check your jaw, face, neck, and shoulder muscles for tension, and try relaxing them. When sitting at  a desk, change positions often, and stretch for 30 seconds each hour.  Get plenty of sleep and exercise.  Eat regularly and well. Long periods without food can trigger a headache.  Treat yourself to a massage. Some people find that regular massages are very helpful in relieving tension.  Limit caffeine by not drinking too much coffee, tea, or soda. But don't quit caffeine suddenly, because that can also give you headaches.  Reduce eyestrain from computers by blinking frequently and looking away from the computer screen every so often. Make  sure you have proper eyewear and that your monitor is set up properly, about an arm's length away.  Seek help if you have depression or anxiety. Your headaches may be linked to these conditions. Treatment can both prevent headaches and help with symptoms of anxiety or depression.

## 2016-06-22 NOTE — ED Triage Notes (Signed)
Patient presents to the ED with nausea and vomiting x 2 weeks.  Patient reports he usually throws up in the morning but generally feels nauseous intermittently throughout the day.  Patient also reports a headache and states that today he has had palpitations, chest pain and shortness of breath that feels like sharp pain when he takes a deep breath.  Patient denies abdominal pain.

## 2016-06-22 NOTE — ED Provider Notes (Signed)
[facility] Emergency Department Provider Note  ____________________________________________  Time seen: Approximately 5:34 PM  I have reviewed the triage vital signs and the nursing notes.   HISTORY  Chief Complaint Emesis   HPI Albert Gonzales is a 26 y.o. male with no significant past medical history who presents for evaluation of headaches. Patient reports for the last 3 weeks he has had morning headaches. The headache is usually 7 out of 10, sharp, frontal, present when he wakes up. He usually drinks coffee and throughout the day the headache will subside. He went to see his ophthalmologist because he thought it was due to his glasses. He received new prescriptions but the headaches have not changed. He has had associated nausea and a few episodes of vomiting with these headaches. He also reports that over the course of the last week he has noticed that his been tripping more often than normal. Patient reports that now the last few days every time he has a headache he becomes very anxious, hyperventilates, and develops shortness of breath and tightness in his chest. Once he is able to calm him down the symptoms go away. He denies any personal or family history of of CNS tumor. Patient denies smoking but endorses that he drinks on the weekends. He also reports that he eats marijuana occasionally. Denies any other drugs. Patient has no personal or family history of ischemic heart disease.  Past Medical History:  Diagnosis Date  . Malignant hyperthermia    pt had arm surgery at [facility] med in 2014 and was told that he developed malignant hyperthermia during surgery  . 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   Past Surgical History:  Procedure Laterality Date  . HUMERUS FRACTURE SURGERY Right   . INGUINAL HERNIA REPAIR Left 09/26/2014   Procedure: LEFT  INGUINAL HERNIA REPAIR ;  Surgeon: Kieth Brightly, MD;  Location: [facility] ORS;  Service: General;  Laterality: Left;  Marland Kitchen MANDIBLE SURGERY Left 2006    Prior to Admission medications   Medication Sig Start Date End Date Taking? Authorizing Provider  dicyclomine (BENTYL) 20 MG tablet Take 1 tablet (20 mg total) by mouth 3 (three) times daily as needed for spasms. 06/03/15   Jennye Moccasin, MD  ondansetron (ZOFRAN) 4 MG tablet Take 1 tablet (4 mg total) by mouth every 8 (eight) hours as needed for nausea or vomiting. 06/03/15   Jennye Moccasin, MD  oxyCODONE-acetaminophen (ROXICET) 5-325 MG per tablet Take 1 tablet by mouth every 4 (four) hours as needed. 09/26/14   Seeplaputhur Wynona Luna, MD    Allergies Patient has no known allergies.  Family History  Problem Relation Age of Onset  . Family history unknown: Yes    Social History Social History  Substance Use Topics  . Smoking status: Never Smoker  . Smokeless tobacco: Never Used  . Alcohol use 0.6 oz/week    1 Cans of beer per week     Comment: Patient states he may drink a beer once a month    Review of Systems  Constitutional: Negative for fever. Eyes: Negative for visual changes. ENT: Negative for sore throat. Neck: No neck pain  Cardiovascular: Negative for chest pain. Respiratory: Negative for shortness of breath. Gastrointestinal: Negative for abdominal pain, diarrhea. + nausea and vomiting Genitourinary: Negative for dysuria. Musculoskeletal: Negative for back pain. Skin: Negative for  rash. Neurological: Negative for weakness or numbness. + HA Psych: No SI or HI  ____________________________________________   PHYSICAL EXAM:  VITAL SIGNS: ED Triage Vitals [06/22/16 1514]  Enc Vitals Group     BP 123/77     Pulse Rate 70     Resp 18     Temp 98.1 F (36.7 C)     Temp Source Oral     SpO2 95 %     Weight 175 lb (79.4 kg)     Height  (1.88 m)     Head Circumference      Peak Flow      Pain Score  10     Pain Loc      Pain Edu?      Excl. in GC?     Constitutional: Alert and oriented. Well appearing and in no apparent distress. HEENT:      Head: Normocephalic and atraumatic.         Eyes: Conjunctivae are normal. Sclera is non-icteric. EOMI. PERRL, intact visual fields.       Mouth/Throat: Mucous membranes are moist.       Neck: Supple with no signs of meningismus. Cardiovascular: Regular rate and rhythm. No murmurs, gallops, or rubs. 2+ symmetrical distal pulses are present in all extremities. No JVD. Respiratory: Normal respiratory effort. Lungs are clear to auscultation bilaterally. No wheezes, crackles, or rhonchi.  Gastrointestinal: Soft, non tender, and non distended with positive bowel sounds. No rebound or guarding. Musculoskeletal: Nontender with normal range of motion in all extremities. No edema, cyanosis, or erythema of extremities. Neurologic: Normal speech and language. A & O x3, PERRL, no nystagmus, CN II-XII intact, motor testing reveals good tone and bulk throughout. There is no evidence of pronator drift or dysmetria. Muscle strength is 5/5 throughout. Deep tendon reflexes are 2+ throughout with downgoing toes. Sensory examination is intact. Gait is normal. Skin: Skin is warm, dry and intact. No rash noted. Psychiatric: Mood and affect are normal. Speech and behavior are normal.  ____________________________________________   LABS (all labs ordered are listed, but only abnormal results are displayed)  Labs Reviewed  URINALYSIS, COMPLETE (UACMP) WITH MICROSCOPIC - Abnormal; Notable for the following:       Result Value   Color, Urine YELLOW (*)    APPearance CLEAR (*)    Squamous Epithelial / LPF 0-5 (*)    All other components within normal limits  URINE DRUG SCREEN, QUALITATIVE ([facility] ONLY) - Abnormal; Notable for the following:    Cannabinoid 50 Ng, Ur Jerome POSITIVE (*)    All other components within normal limits  LIPASE, BLOOD  COMPREHENSIVE METABOLIC  PANEL  CBC  TROPONIN I  TROPONIN I   ____________________________________________  EKG  ED ECG REPORT I, Nita Sickle, the attending physician, personally viewed and interpreted this ECG.   Normal sinus rhythm, rate of 69, normal intervals, normal axis,  ____________________________________________  RADIOLOGY  Head CT: Negative  MRI brain: Negative ____________________________________________   PROCEDURES  Procedure(s) performed: None Procedures Critical Care performed:  None ____________________________________________   INITIAL IMPRESSION / ASSESSMENT AND PLAN / ED COURSE   26 y.o. male with no significant past medical history who presents for evaluation of daily am headaches, nausea, and vomiting. Patient is neurologically intact including pupils equal round and reactive, and intact visual fields, no dysmetria, no cranial nerves abnormalities. This is most likely migraine versus tension headaches however since the headaches are most pronounced in the morning that makes me concerned for  a brain mass. We'll get a CT of his head. The chest pain is concerning for hyperventilation and anxiety attacks. His EKG is normal. Troponin is negative. Last episode of chest pain was yesterday therefore no indication for a second troponin. Patient's heart score is 0.  Clinical Course as of Jun 23 1939  Mon Jun 22, 2016  1927 CT and MRI with no evidence of mass or any other acute findings. Patient remains with no symptoms at this time. Recommended that he tries different pillows at home since the headache seems to be worse when he wakes up in the morning. We'll also refer patient to see a neurologist as an outpatient for further management.  [CV]    Clinical Course User Index [CV] Nita Sickle, MD    Pertinent labs & imaging results that were available during my care of the patient were reviewed by me and considered in my medical decision making (see chart for  details).    ____________________________________________   FINAL CLINICAL IMPRESSION(S) / ED DIAGNOSES  Final diagnoses:  Acute nonintractable headache, unspecified headache type      NEW MEDICATIONS STARTED DURING THIS VISIT:  New Prescriptions   No medications on file     Note:  This document was prepared using Dragon voice recognition software and may include unintentional dictation errors.    Nita Sickle, MD 06/22/16 [phone]

## 2016-06-22 NOTE — ED Notes (Signed)
Pt transferred to MRI by ED tech

## 2016-07-07 ENCOUNTER — Emergency Department: Payer: BLUE CROSS/BLUE SHIELD

## 2016-07-07 ENCOUNTER — Emergency Department
Admission: EM | Admit: 2016-07-07 | Discharge: 2016-07-07 | Disposition: A | Discharge status: Home / Self Care | Payer: BLUE CROSS/BLUE SHIELD | Attending: Emergency Medicine | Admitting: Emergency Medicine

## 2016-07-07 ENCOUNTER — Encounter: Payer: Self-pay | Admitting: Emergency Medicine

## 2016-07-07 DIAGNOSIS — M25532 Pain in left wrist: Secondary | ICD-10-CM | POA: Diagnosis present

## 2016-07-07 DIAGNOSIS — M778 Other enthesopathies, not elsewhere classified: Secondary | ICD-10-CM

## 2016-07-07 DIAGNOSIS — M65842 Other synovitis and tenosynovitis, left hand: Secondary | ICD-10-CM | POA: Diagnosis not present

## 2016-07-07 MED ORDER — IBUPROFEN 800 MG PO TABS: 800.0000 mg | ORAL_TABLET | Freq: Three times a day (TID) | ORAL | 0 refills | Status: DC | PRN

## 2016-07-07 MED ORDER — KETOROLAC TROMETHAMINE 30 MG/ML IJ SOLN
60.0000 mg | Freq: Once | INTRAMUSCULAR | Status: AC
  Administered 2016-07-07: 60 mg via INTRAMUSCULAR
  Filled 2016-07-07: qty 2

## 2016-07-07 MED ORDER — HYDROCODONE-ACETAMINOPHEN 5-325 MG PO TABS: 1.0000 | ORAL_TABLET | Freq: Four times a day (QID) | ORAL | 0 refills | Status: DC | PRN

## 2016-07-07 NOTE — ED Provider Notes (Signed)
[facility] Emergency Department Provider Note   ____________________________________________   First MD Initiated Contact with Patient 07/07/16 [phone]     (approximate)  I have reviewed the triage vital signs and the nursing notes.   HISTORY  Chief Complaint Wrist Pain    HPI Albert Gonzales is a 26 y.o. male who presents to the ED from home with a chief complaint of left wrist pain. Patient reports nontraumatic pain 4 days. Noted pain to nondominant wrist the following day after helping a friend shovel mulch for a prolonged period of time.Complains of pain and swelling to left wrist. Movement makes the pain worse. Denies associated extremity weakness, numbness/tingling. Denies fall/trauma/injury. Denies other complaints or injuries.   Past Medical History:  Diagnosis Date  . Malignant hyperthermia    pt had arm surgery at [facility] med in 2014 and was told that he developed malignant hyperthermia during surgery  . 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   Past Surgical History:  Procedure Laterality Date  . HUMERUS FRACTURE SURGERY Right   . INGUINAL HERNIA REPAIR Left 09/26/2014   Procedure: LEFT INGUINAL HERNIA REPAIR ;  Surgeon: Kieth Brightly, MD;  Location: [facility] ORS;  Service: General;  Laterality: Left;  Marland Kitchen MANDIBLE SURGERY Left 2006    Prior to Admission medications   Medication Sig Start Date End Date Taking? Authorizing Provider  dicyclomine (BENTYL) 20 MG tablet Take 1 tablet (20 mg total) by mouth 3 (three) times daily as needed for spasms. 06/03/15   Jennye Moccasin, MD  HYDROcodone-acetaminophen (NORCO) 5-325 MG tablet Take 1 tablet by mouth every 6 (six) hours as needed for moderate pain. 07/07/16   Irean Hong, MD  ibuprofen (ADVIL,MOTRIN) 800 MG tablet Take 1 tablet (800 mg total) by mouth every 8 (eight) hours as needed for  moderate pain. 07/07/16   Irean Hong, MD  ondansetron (ZOFRAN) 4 MG tablet Take 1 tablet (4 mg total) by mouth every 8 (eight) hours as needed for nausea or vomiting. 06/03/15   Jennye Moccasin, MD  oxyCODONE-acetaminophen (ROXICET) 5-325 MG per tablet Take 1 tablet by mouth every 4 (four) hours as needed. 09/26/14   Seeplaputhur Wynona Luna, MD    Allergies Patient has no known allergies.  Family History  Problem Relation Age of Onset  . Family history unknown: Yes    Social History Social History  Substance Use Topics  . Smoking status: Never Smoker  . Smokeless tobacco: Never Used  . Alcohol use 0.6 oz/week    1 Cans of beer per week     Comment: Patient states he may drink a beer once a month    Review of Systems  Constitutional: No fever/chills. Eyes: No visual changes. ENT: No sore throat. Cardiovascular: Denies chest pain. Respiratory: Denies shortness of breath. Gastrointestinal: No abdominal pain.  No nausea, no vomiting.  No diarrhea.  No constipation. Genitourinary: Negative for dysuria. Musculoskeletal: Positive for left wrist pain and swelling. Negative for back pain. Skin: Negative for rash. Neurological: Negative for headaches, focal weakness or numbness.   ____________________________________________   PHYSICAL EXAM:  VITAL SIGNS: ED Triage Vitals  Enc Vitals Group     BP 07/07/16 0437 (!) 147/94     Pulse Rate 07/07/16 0437 62     Resp 07/07/16 0437 18     Temp 07/07/16 0437 97.7 F (  36.5 C)     Temp Source 07/07/16 0437 Oral     SpO2 07/07/16 0437 97 %     Weight 07/07/16 0437 180 lb (81.6 kg)     Height 07/07/16 0437  (1.88 m)     Head Circumference --      Peak Flow --      Pain Score 07/07/16 0436 8     Pain Loc --      Pain Edu? --      Excl. in GC? --     Constitutional: Alert and oriented. Well appearing and in no acute distress. Eyes: Conjunctivae are normal. PERRL. EOMI. Head: Atraumatic. Nose: No  congestion/rhinnorhea. Mouth/Throat: Mucous membranes are moist.  Oropharynx non-erythematous. Neck: No stridor.  No cervical spine tenderness to palpation. Cardiovascular: Normal rate, regular rhythm. Grossly normal heart sounds.  Good peripheral circulation. Respiratory: Normal respiratory effort.  No retractions. Lungs CTAB. Gastrointestinal: Soft and nontender. No distention. No abdominal bruits. No CVA tenderness. Musculoskeletal:  Left wrist: Mild swelling to dorsal wrist. Limited range of motion secondary to pain. Pain on extending fingers. +Tinel's. Tender to palpation at carpal retinaculum. 2+ radial pulses. Brisk, less than 5 second capillary refill. Neurologic:  Normal speech and language. No gross focal neurologic deficits are appreciated. No gait instability. Skin:  Skin is warm, dry and intact. No rash noted. Psychiatric: Mood and affect are normal. Speech and behavior are normal.  ____________________________________________   LABS (all labs ordered are listed, but only abnormal results are displayed)  Labs Reviewed - No data to display ____________________________________________  EKG  none ____________________________________________  RADIOLOGY  Left wrist complete interpreted per Dr. Gwenyth Bender: 1. No acute fracture or dislocation.  2. Mild soft tissue swelling of the dorsum of the hand. Clinical  correlation is recommended.  3. Small bony densities along the volar aspect of the distal radius,  likely sequela of prior trauma.   ____________________________________________   PROCEDURES  Procedure(s) performed: None  Procedures  Critical Care performed: No  ____________________________________________   INITIAL IMPRESSION / ASSESSMENT AND PLAN / ED COURSE  Pertinent labs & imaging results that were available during my care of the patient were reviewed by me and considered in my medical decision making (see chart for details).  26 year old male who  presents with left wrist pain and swelling after shoveling a truck load of mulch. Clinically suspect tendinitis secondary to overuse injury. Will place in Velcro wrist splint, placed on NSAIDs and analgesia; referred to orthopedics for follow-up. Strict return precautions given. Patient verbalizes understanding and agrees with plan of care.      ____________________________________________   FINAL CLINICAL IMPRESSION(S) / ED DIAGNOSES  Final diagnoses:  Left wrist pain  Tendonitis of wrist, left      NEW MEDICATIONS STARTED DURING THIS VISIT:  New Prescriptions   HYDROCODONE-ACETAMINOPHEN (NORCO) 5-325 MG TABLET    Take 1 tablet by mouth every 6 (six) hours as needed for moderate pain.   IBUPROFEN (ADVIL,MOTRIN) 800 MG TABLET    Take 1 tablet (800 mg total) by mouth every 8 (eight) hours as needed for moderate pain.     Note:  This document was prepared using Dragon voice recognition software and may include unintentional dictation errors.    Irean Hong, MD 07/07/16 [phone]

## 2016-07-07 NOTE — ED Triage Notes (Signed)
.  Patient ambulatory to triage with steady gait, without difficulty or distress noted; pt reports left wrist pain for several days; denies any specific injury but st pain began after helping someone with yardwork

## 2016-07-07 NOTE — Discharge Instructions (Signed)
1. You may take pain medicines as needed (Motrin/Norco #15). 2. Wear wrist splint as needed for comfort. 3. Apply ice to affected area several times daily to reduce swelling. 4. Return to the ER for worsening symptoms, persistent vomiting, difficulty breathing or other concerns.

## 2018-05-05 IMAGING — CT CT HEAD W/O CM
3 series · 14 of 47 positions shown, 16 images · non-contrast
Comparison: None.

CLINICAL DATA: Nausea and vomiting for 2 weeks.

EXAM:
CT HEAD WITHOUT CONTRAST
TECHNIQUE: Contiguous axial images were obtained from the base of the skull
through the vertex without intravenous contrast.

[Series 2: head wo · axial · 0.47mm/px · z∈[+619,+744]mm · 8 of 30 slices shown, 10 images]
[im 3/30  brain]
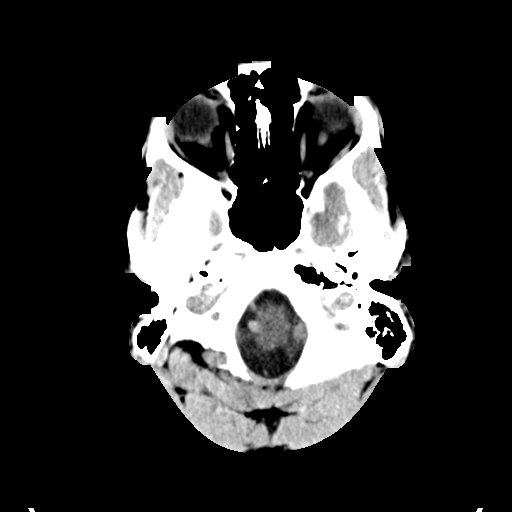
[im 3/30  bone]
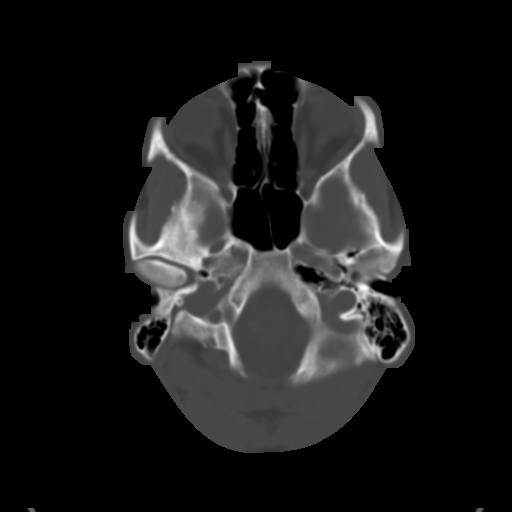
[im 7/30  brain]
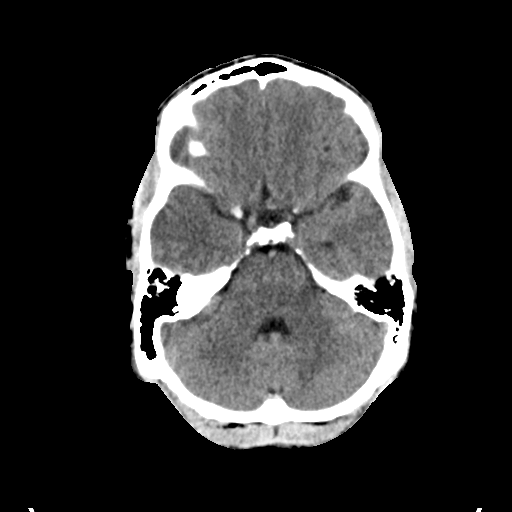
[im 10/30  brain]
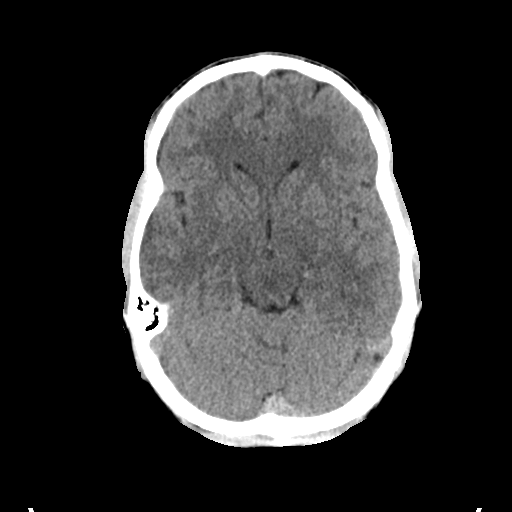
[im 14/30  brain]
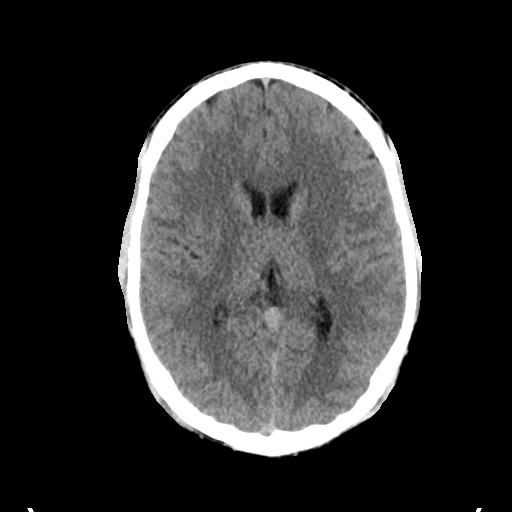
[im 17/30  brain]
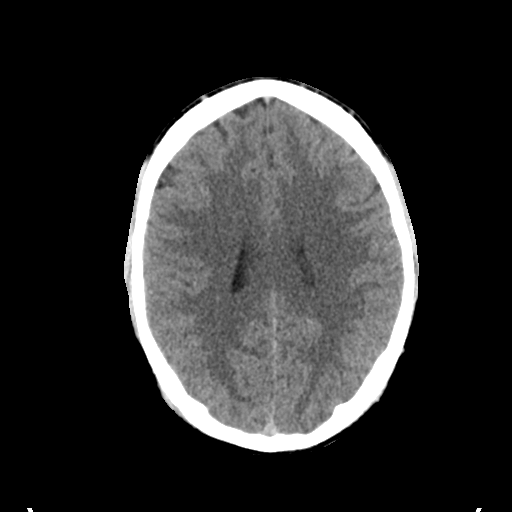
[im 17/30  bone]
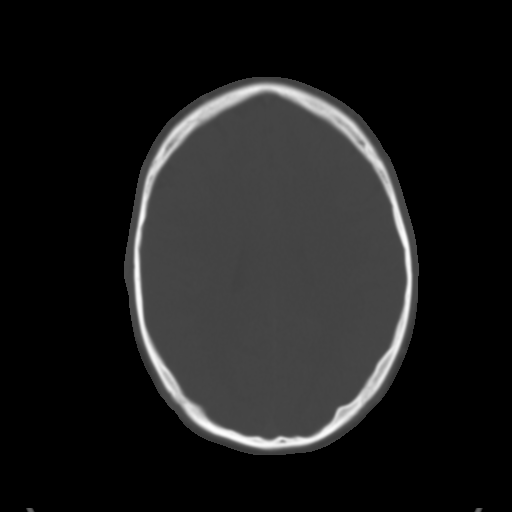
[im 21/30  brain]
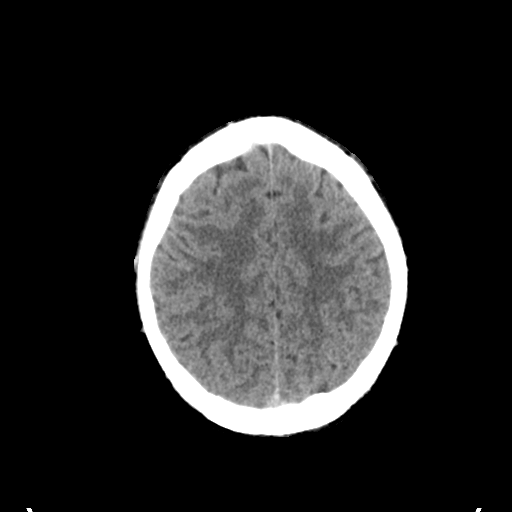
[im 24/30  brain]
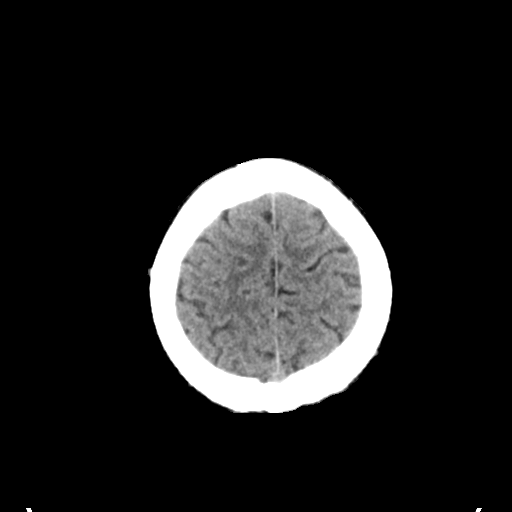
[im 28/30  brain]
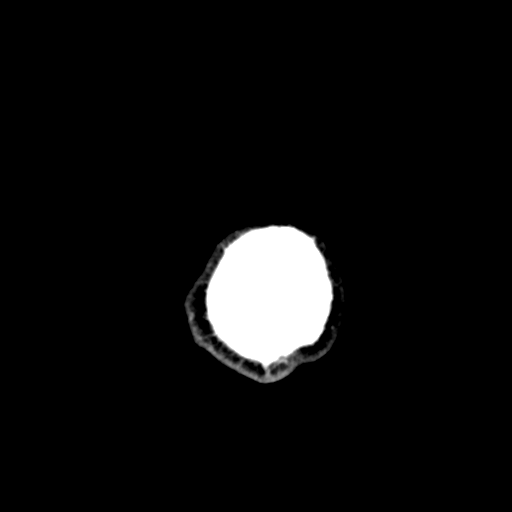

[Series 4: coronal soft tissue · coronal · 0.29mm/px · 3 of 61 slices shown]
[im 21/61  brain]
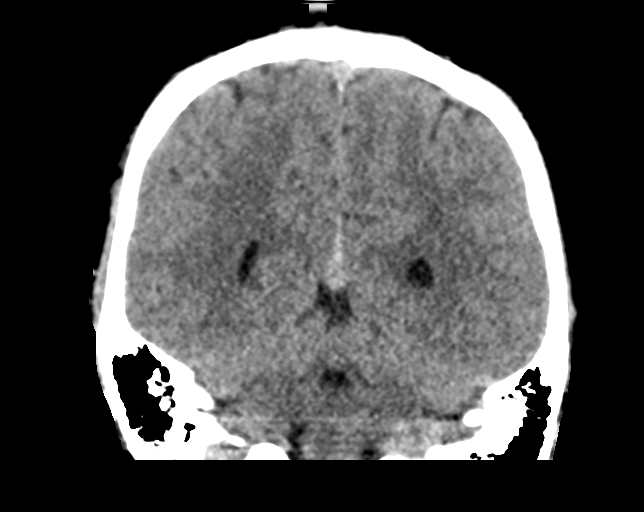
[im 27/61  brain]
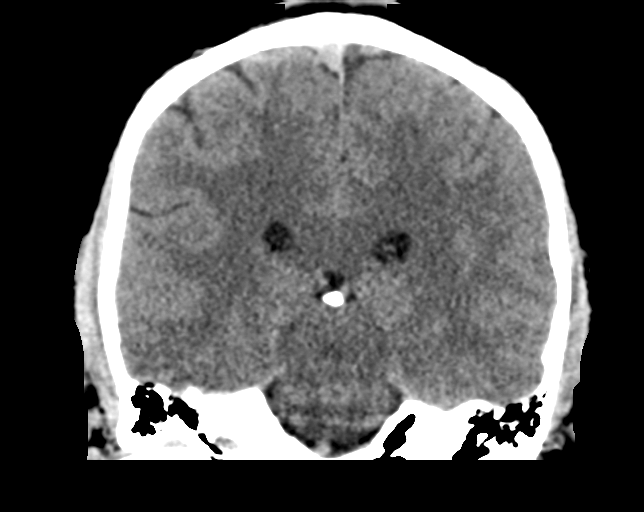
[im 34/61  brain]
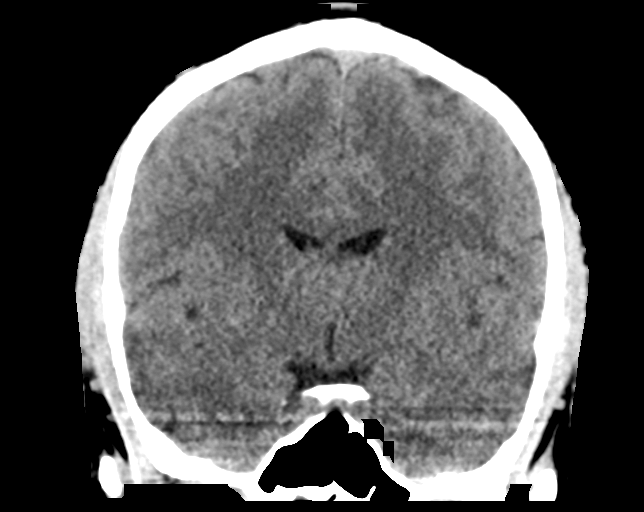

[Series 5: sagittal soft tissue · sagittal · 0.28mm/px · 3 of 51 slices shown]
[im 17/51  brain]
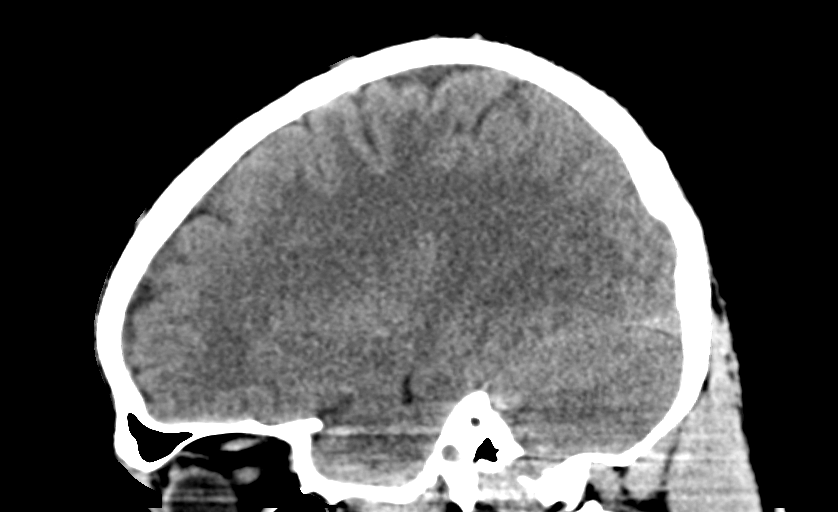
[im 26/51  brain]
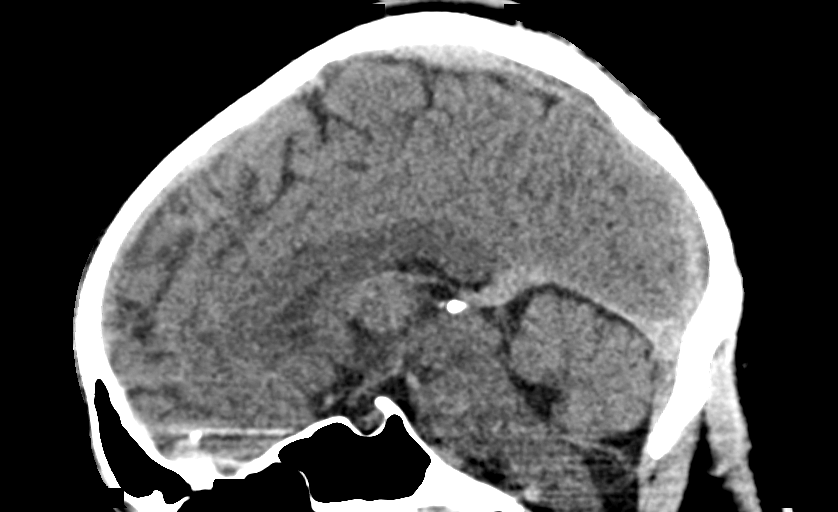
[im 34/51  brain]
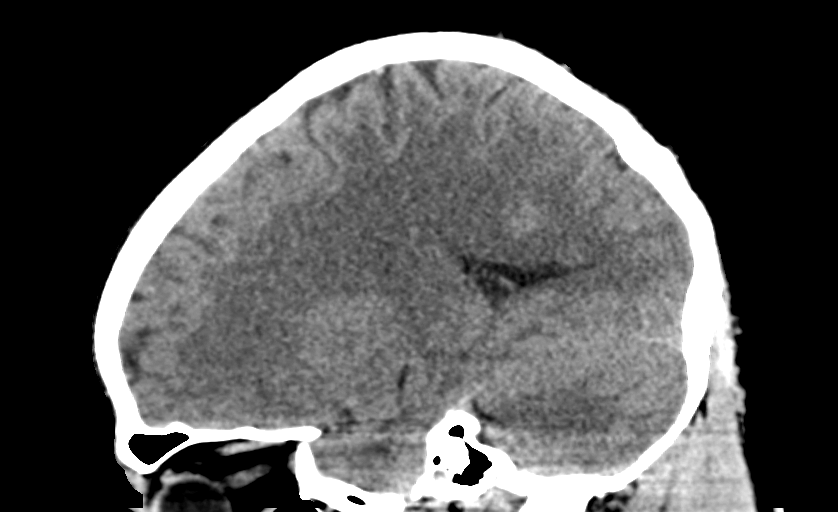

[14 of 47 positions shown; findings below may reference images not displayed]

FINDINGS: Brain: No evidence of acute infarction, hemorrhage, hydrocephalus,
extra-axial collection or mass lesion/mass effect. Normal cerebral
volume. No white matter disease.

Vascular: No hyperdense vessel or unexpected calcification.

Skull: Normal. Negative for fracture or focal lesion.

Sinuses/Orbits: No acute finding.

Other: None.
IMPRESSION: Negative exam.

## 2018-10-13 ENCOUNTER — Other Ambulatory Visit: Payer: Self-pay

## 2018-10-13 DIAGNOSIS — Z20822 Contact with and (suspected) exposure to covid-19: Secondary | ICD-10-CM

## 2018-10-15 LAB — NOVEL CORONAVIRUS, NAA: SARS-CoV-2, NAA: NOT DETECTED

## 2019-04-19 ENCOUNTER — Ambulatory Visit: Payer: Self-pay | Attending: Internal Medicine

## 2019-04-19 DIAGNOSIS — Z20822 Contact with and (suspected) exposure to covid-19: Secondary | ICD-10-CM

## 2019-04-19 DIAGNOSIS — U071 COVID-19: Secondary | ICD-10-CM | POA: Insufficient documentation

## 2019-04-20 LAB — NOVEL CORONAVIRUS, NAA: SARS-CoV-2, NAA: DETECTED — AB

## 2019-04-21 ENCOUNTER — Telehealth: Payer: Self-pay | Admitting: Adult Health

## 2019-04-21 NOTE — Telephone Encounter (Signed)
Called to speak Albert Gonzales about recent + SARS-CoV-2 test, COVID sx's, and monoclonal infusion. Home number is disconnected. Mobile- no answer. Last ht/wt in system- 06/2016, calculated BMI 23. Very limited information in chart, last contact with his PCP 08/2014. Recommend call back to discuss current medical hx, sx onset, etc.  William Hamburger, NP-C

## 2020-08-30 ENCOUNTER — Ambulatory Visit (INDEPENDENT_AMBULATORY_CARE_PROVIDER_SITE_OTHER): Payer: 59 | Admitting: Family Medicine

## 2020-08-30 ENCOUNTER — Other Ambulatory Visit: Payer: Self-pay | Admitting: Family Medicine

## 2020-08-30 ENCOUNTER — Other Ambulatory Visit: Payer: Self-pay

## 2020-08-30 ENCOUNTER — Encounter: Payer: Self-pay | Admitting: Family Medicine

## 2020-08-30 VITALS — BP 126/87 | HR 67 | Ht 74.0 in | Wt 208.8 lb

## 2020-08-30 DIAGNOSIS — J3089 Other allergic rhinitis: Secondary | ICD-10-CM | POA: Insufficient documentation

## 2020-08-30 DIAGNOSIS — R7989 Other specified abnormal findings of blood chemistry: Secondary | ICD-10-CM | POA: Diagnosis not present

## 2020-08-30 DIAGNOSIS — G8929 Other chronic pain: Secondary | ICD-10-CM

## 2020-08-30 DIAGNOSIS — M545 Low back pain, unspecified: Secondary | ICD-10-CM | POA: Insufficient documentation

## 2020-08-30 DIAGNOSIS — Z114 Encounter for screening for human immunodeficiency virus [HIV]: Secondary | ICD-10-CM

## 2020-08-30 DIAGNOSIS — Z0000 Encounter for general adult medical examination without abnormal findings: Secondary | ICD-10-CM

## 2020-08-30 DIAGNOSIS — Z7689 Persons encountering health services in other specified circumstances: Secondary | ICD-10-CM | POA: Diagnosis not present

## 2020-08-30 DIAGNOSIS — Z1159 Encounter for screening for other viral diseases: Secondary | ICD-10-CM

## 2020-08-30 MED ORDER — LORATADINE 10 MG PO TABS: 10.0000 mg | ORAL_TABLET | Freq: Every day | ORAL | 3 refills | Status: AC

## 2020-08-30 MED ORDER — MONTELUKAST SODIUM 10 MG PO TABS: 10.0000 mg | ORAL_TABLET | Freq: Every day | ORAL | 3 refills | Status: DC

## 2020-08-30 NOTE — Patient Instructions (Addendum)
Thank you for coming to the office today.  [facility] [address] [place], [place]  [address] [phone] (phone)  Referral to Allergist for testing and further eval  Go ahead and start Montelukast (Singulair) 10mg  nightly every day for now, can take seasonally or 3-6 months out of year if prefer.  Start Claritin in Feb or before Allergy season starts.  ---------------------------------------   Referral to Urology for further testosterone treatment and further evaluation and management. Please mention about the blood donation as well and they can review this.  [facility] -1st floor [address] [place],  [place]  [place] Phone: [phone]  --------------------------------------------------------------------------------  [facility]; [facility] Chiropractor in [place], [place] Address: [address] [place], [place] Phone: [phone]     Please schedule a Follow-up Appointment to: No follow-ups on file.  If you have any other questions or concerns, please feel free to call the office or send a message through MyChart. You may also schedule an earlier appointment if necessary.  Additionally, you may be receiving a survey about your experience at our office within a few days to 1 week by e-mail or mail. We value your feedback.  [phone], DO [facility], [facility]

## 2020-08-30 NOTE — Progress Notes (Signed)
Subjective:    Patient ID: Albert Gonzales, male    DOB: 1990/06/20, 30 y.o.   MRN: [id number]  Albert Gonzales is a 30 y.o. male presenting on 08/30/2020 for Establish Care and Allergies  Here to establish care with new PCP, previously born in [place], moved to [place] age 88 yr.  HPI  Allergies Environmental / Seasonal He reports having persistent issue with recurrent flare up with allergies, he can have some breathing symptoms associated with flares. He tried some OTC Claritin and it did help his symptoms. Now seems less effective Admits itchy watery eyes congestion drainage cough. Never diagnosed with Asthma.  Low Testosterone Since 01/2020, history in past low energy levels fatigue, not functioning as he normally was. Previously managed in [place], he located a Patent attorney and they did remote testing through [employer] and did a telehealth visit through [facility]. He was rx Testosterone injection. He also had an elevated Hgb. He donates blood for elevated Hgb. He asks about referral to manage low testosterone currently has 1.5 month left of medication.  Chronic Low Back Pain Reports issue prior years ago in high school he had a fractured tailbone. - He has currently been going to Medical sales representative. With some improvement but wants to see new chiropractor that can be covered under insurance.  He works as a Charity fundraiser for [employer], [place]. Seated mostly at work.  Exercise regimen twice a week with weight lifting and jogging, occasional weekend.   Health Maintenance: Due for routine hep c and hiv screening labs No known fam history of cancer.  Depression screen [facility] 2/9 08/30/2020 09/05/2014  Decreased Interest 0 0  Down, Depressed, Hopeless 0 0  PHQ - 2 Score 0 0  Altered sleeping 0 -  Tired, decreased energy 0 -  Change in appetite 0 -  Feeling bad or failure about yourself  0 -  Trouble concentrating 0 -  Moving slowly or fidgety/restless 0 -  Suicidal thoughts  0 -  PHQ-9 Score 0 -  Difficult doing work/chores Not difficult at all -    Past Medical History:  Diagnosis Date   Malignant hyperthermia    pt had arm surgery at [facility] in 2014 and was told that he developed malignant hyperthermia during surgery   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   during humerus surgery at [facility]   Past Surgical History:  Procedure Laterality Date   HUMERUS FRACTURE SURGERY Right 2014   INGUINAL HERNIA REPAIR Left 09/26/2014   Procedure: LEFT INGUINAL HERNIA REPAIR ;  Surgeon: Kieth Brightly, MD;  Location: [facility] ORS;  Service: General;  Laterality: Left;   MANDIBLE SURGERY Left 03/16/2004   Social History   Socioeconomic History   Marital status: Single    Spouse name: Not on file   Number of children: Not on file   Years of education: Not on file   Highest education level: Not on file  Occupational History   Not on file  Tobacco Use   Smoking status: Never   Smokeless tobacco: Never  Substance and Sexual Activity   Alcohol use: Yes    Alcohol/week: 1.0 standard drink    Types: 1 Cans of beer per week    Comment: Patient states he may drink a beer once a month   Drug use: No   Sexual activity: Yes    Partners: Female    Birth control/protection: Condom  Other Topics Concern   Not on file  Social  History Narrative   Not on file   Social Determinants of Health   Financial Resource Strain: Not on file  Food Insecurity: Not on file  Transportation Needs: Not on file  Physical Activity: Not on file  Stress: Not on file  Social Connections: Not on file  Intimate Partner Violence: Not on file   Family History  Family history unknown: Yes   Current Outpatient Medications on File Prior to Visit  Medication Sig   testosterone cypionate (DEPOTESTOSTERONE CYPIONATE) 200 MG/ML injection Inject 200 mg into the muscle daily.   No current facility-administered medications on file prior to visit.    Review of Systems Per  HPI unless specifically indicated above      Objective:    BP 126/87   Pulse 67   Ht 6\' 2"  (1.88 m)   Wt 208 lb 12.8 oz (94.7 kg)   SpO2 94%   BMI 26.81 kg/m   Wt Readings from Last 3 Encounters:  08/30/20 208 lb 12.8 oz (94.7 kg)  07/07/16 180 lb (81.6 kg)  06/22/16 175 lb (79.4 kg)    Physical Exam Vitals and nursing note reviewed.  Constitutional:      General: He is not in acute distress.    Appearance: Normal appearance. He is well-developed. He is not diaphoretic.     Comments: Well-appearing, comfortable, cooperative  HENT:     Head: Normocephalic and atraumatic.  Eyes:     General:        Right eye: No discharge.        Left eye: No discharge.     Conjunctiva/sclera: Conjunctivae normal.  Cardiovascular:     Rate and Rhythm: Normal rate.  Pulmonary:     Effort: Pulmonary effort is normal.  Skin:    General: Skin is warm and dry.     Findings: No erythema or rash.  Neurological:     Mental Status: He is alert and oriented to person, place, and time.  Psychiatric:        Mood and Affect: Mood normal.        Behavior: Behavior normal.        Thought Content: Thought content normal.     Comments: Well groomed, good eye contact, normal speech and thoughts   Results for orders placed or performed in visit on 04/19/19  Novel Coronavirus, NAA ([facility])   Specimen: Nasopharyngeal(NP) swabs in vial transport medium   NASOPHARYNGE  TESTING  Result Value Ref Range   SARS-CoV-2, NAA Detected (A) Not Detected      Assessment & Plan:   Problem List Items Addressed This Visit     Low testosterone in male   Relevant Orders   Ambulatory referral to Urology   Environmental and seasonal allergies - Primary   Relevant Medications   montelukast (SINGULAIR) 10 MG tablet   loratadine (CLARITIN) 10 MG tablet   Other Relevant Orders   Ambulatory referral to Allergy   Chronic midline low back pain without sciatica   Relevant Orders   Ambulatory referral to  Chiropractic   Other Visit Diagnoses     Encounter to establish care with new doctor           #Low T Recent problem >6 months diagnosed and confirmed on lab testing from telemedicine doctor in 06/17/19 after having persistent clinical symptoms fatigue etc. Improved function on testosterone therapy currently Complication of elevated Hgb, donating blood currently PRN  Will refer to [facility] for further management of low testosterone in  male, at age 29 will be important to fully evaluate this by Urology before continuing and managing testosterone. He will do physical labs with me but will defer repeat blood check on testosterone and can check it with Urologist.  #Chronic low back pain Hx tailbone fracture years ago, some assoc chronic low back pain Will refer to [facility] chiropractor locally in [place], they can do imaging or we can in future F/u PRN  #Allergies, environmental / seasonal Significant symptoms persistent problem, some improvement on oral 2nd gen anti histamine Pursue allergy testing with Allergy Referral today, identify allergens Rx Singulair 10mg  nightly Offer OTC Flonase PRN if need Follow up  Meds ordered this encounter  Medications   montelukast (SINGULAIR) 10 MG tablet    Sig: Take 1 tablet (10 mg total) by mouth at bedtime.    Dispense:  90 tablet    Refill:  3   loratadine (CLARITIN) 10 MG tablet    Sig: Take 1 tablet (10 mg total) by mouth daily. Use for 4-6 weeks then stop, and use as needed or seasonally    Dispense:  90 tablet    Refill:  3   Orders Placed This Encounter  Procedures   Ambulatory referral to Allergy    Referral Priority:   Routine    Referral Type:   Allergy Testing    Referral Reason:   Specialty Services Required    Requested Specialty:   Allergy    Number of Visits Requested:   1   Ambulatory referral to Urology    Referral Priority:   Routine    Referral Type:   Consultation    Referral Reason:   Specialty Services Required     Requested Specialty:   Urology    Number of Visits Requested:   1   Ambulatory referral to Chiropractic    Referral Priority:   Routine    Referral Type:   Chiropractic    Referral Reason:   Specialty Services Required    Requested Specialty:   Chiropractic Medicine    Number of Visits Requested:   1      Follow up plan: Return in about 3 months (around 11/30/2020) for 3 month fasting lab only then 1 week later Annual Physical.  Future labs - CMET CBC LIPID HIV, Hep C 11/20/20  01/20/21, DO [facility] 08/30/2020, 10:22 AM

## 2020-09-11 ENCOUNTER — Encounter: Payer: Self-pay | Admitting: Urology

## 2020-09-11 ENCOUNTER — Ambulatory Visit (INDEPENDENT_AMBULATORY_CARE_PROVIDER_SITE_OTHER): Payer: 59 | Admitting: Urology

## 2020-09-11 ENCOUNTER — Other Ambulatory Visit: Payer: Self-pay

## 2020-09-11 VITALS — BP 131/81 | HR 87 | Ht 74.0 in | Wt 210.0 lb

## 2020-09-11 DIAGNOSIS — E291 Testicular hypofunction: Secondary | ICD-10-CM

## 2020-09-11 NOTE — Progress Notes (Signed)
   09/11/2020 3:40 PM   Albert Gonzales [birth year] [id number]  Referring provider: Smitty Cords, DO [address] [place],  [place] [address]  Chief Complaint  Patient presents with   Hypogonadism    HPI: Albert Gonzales is a 30 y.o. male referred for evaluation of hypogonadism.  30 y.o. male who has been on TRT prescribed by a medical provider in [place] via telehealth Due to insurance/cost he wants to establish care with a local provider He is currently injecting 200 mg testosterone cypionate weekly and hCG twice weekly    Assessment & Plan:    I discussed with patient that I follow AUA practice guidelines in the management of my hypogonadism patients and the AUA guidelines do not recommend use of hCG unless patients are actively attempting contraception If he desires to continue hCG injections will need to establish care with a provider who prescribes this medication.  I am not aware of any urologist in the area that prescribe hCG and would recommend endocrinology referral. Patient was not charged for this visit   Riki Altes, MD  [facility] [address] [place], [place] [address] [phone]

## 2020-09-18 ENCOUNTER — Telehealth: Payer: Self-pay | Admitting: Family Medicine

## 2020-09-18 NOTE — Telephone Encounter (Signed)
Please notify patient that I have received info from his new Urologist and that they do not offer the combination of testosterone + hcg treatment for Low T.  The Urologist contacted me to refer him to an Endocrinologist to discuss this issue more.  I just recently spoke on Epic Staff Message to [facility] Dr Lonzo Cloud and she has advised me that their office does not treat with both either, however they can treat with one or the other.  It depends more on the circumstances on the source of low testosterone. If it is a pituitary issue in the brain, then hcg would work. Otherwise if the cause of Low T is from his testes, then typically the testosterone is the treatment.  I can submit a referral to her office for the patient to discuss therapy options with them, but as long as he is aware that treatment with BOTH is not recommended and they cannot offer that.  Let me know. Otherwise, if he wants to just stick with only testosterone - we can also reach back out to Dr Lonna Cobb at [facility] to see if he wants to return to him.  Saralyn Pilar, DO [facility] 09/18/2020, 11:28 AM

## 2020-09-20 NOTE — Telephone Encounter (Signed)
Left message to call back  

## 2020-11-19 ENCOUNTER — Other Ambulatory Visit: Payer: Self-pay

## 2020-11-19 DIAGNOSIS — Z114 Encounter for screening for human immunodeficiency virus [HIV]: Secondary | ICD-10-CM

## 2020-11-19 DIAGNOSIS — Z1159 Encounter for screening for other viral diseases: Secondary | ICD-10-CM

## 2020-11-19 DIAGNOSIS — Z0000 Encounter for general adult medical examination without abnormal findings: Secondary | ICD-10-CM

## 2020-11-20 ENCOUNTER — Other Ambulatory Visit: Payer: 59

## 2020-11-20 ENCOUNTER — Other Ambulatory Visit: Payer: Self-pay

## 2020-11-21 LAB — CBC WITH DIFFERENTIAL/PLATELET
Absolute Monocytes: 520 cells/uL (ref 200–950)
Basophils Absolute: 39 cells/uL (ref 0–200)
Basophils Relative: 0.9 %
Eosinophils Absolute: 90 cells/uL (ref 15–500)
Eosinophils Relative: 2.1 %
HCT: 45.1 % (ref 38.5–50.0)
Hemoglobin: 14.9 g/dL (ref 13.2–17.1)
Lymphs Abs: 2146 cells/uL (ref 850–3900)
MCH: 28.1 pg (ref 27.0–33.0)
MCHC: 33 g/dL (ref 32.0–36.0)
MCV: 84.9 fL (ref 80.0–100.0)
MPV: 11.6 fL (ref 7.5–12.5)
Monocytes Relative: 12.1 %
Neutro Abs: 1505 cells/uL (ref 1500–7800)
Neutrophils Relative %: 35 %
Platelets: 252 10*3/uL (ref 140–400)
RBC: 5.31 10*6/uL (ref 4.20–5.80)
RDW: 13.5 % (ref 11.0–15.0)
Total Lymphocyte: 49.9 %
WBC: 4.3 10*3/uL (ref 3.8–10.8)

## 2020-11-21 LAB — HEPATITIS C ANTIBODY
Hepatitis C Ab: NONREACTIVE
SIGNAL TO CUT-OFF: 0.01 (ref <1.00)

## 2020-11-21 LAB — COMPLETE METABOLIC PANEL WITH GFR
AG Ratio: 1.6 (calc) (ref 1.0–2.5)
ALT: 27 U/L (ref 9–46)
AST: 17 U/L (ref 10–40)
Albumin: 4.5 g/dL (ref 3.6–5.1)
Alkaline phosphatase (APISO): 58 U/L (ref 36–130)
BUN/Creatinine Ratio: 16 (calc) (ref 6–22)
BUN: 22 mg/dL (ref 7–25)
CO2: 29 mmol/L (ref 20–32)
Calcium: 9.9 mg/dL (ref 8.6–10.3)
Chloride: 105 mmol/L (ref 98–110)
Creat: 1.35 mg/dL — ABNORMAL HIGH (ref 0.60–1.26)
Globulin: 2.9 g/dL (calc) (ref 1.9–3.7)
Glucose, Bld: 82 mg/dL (ref 65–99)
Potassium: 4.1 mmol/L (ref 3.5–5.3)
Sodium: 140 mmol/L (ref 135–146)
Total Bilirubin: 0.3 mg/dL (ref 0.2–1.2)
Total Protein: 7.4 g/dL (ref 6.1–8.1)
eGFR: 72 mL/min/{1.73_m2} (ref >=60)

## 2020-11-21 LAB — LIPID PANEL
Cholesterol: 189 mg/dL (ref <200)
HDL: 36 mg/dL — ABNORMAL LOW (ref >=40)
LDL Cholesterol (Calc): 112 mg/dL (calc) — ABNORMAL HIGH
Non-HDL Cholesterol (Calc): 153 mg/dL (calc) — ABNORMAL HIGH (ref <130)
Total CHOL/HDL Ratio: 5.3 (calc) — ABNORMAL HIGH (ref <5.0)
Triglycerides: 309 mg/dL — ABNORMAL HIGH (ref <150)

## 2020-11-21 LAB — HIV ANTIBODY (ROUTINE TESTING W REFLEX): HIV 1&2 Ab, 4th Generation: NONREACTIVE

## 2020-11-27 ENCOUNTER — Ambulatory Visit (INDEPENDENT_AMBULATORY_CARE_PROVIDER_SITE_OTHER): Payer: 59 | Admitting: Family Medicine

## 2020-11-27 ENCOUNTER — Other Ambulatory Visit: Payer: Self-pay

## 2020-11-27 ENCOUNTER — Other Ambulatory Visit: Payer: Self-pay | Admitting: Family Medicine

## 2020-11-27 ENCOUNTER — Encounter: Payer: Self-pay | Admitting: Family Medicine

## 2020-11-27 VITALS — BP 135/83 | HR 65 | Ht 74.0 in | Wt 203.0 lb

## 2020-11-27 DIAGNOSIS — Z0000 Encounter for general adult medical examination without abnormal findings: Secondary | ICD-10-CM

## 2020-11-27 DIAGNOSIS — E785 Hyperlipidemia, unspecified: Secondary | ICD-10-CM

## 2020-11-27 DIAGNOSIS — Z23 Encounter for immunization: Secondary | ICD-10-CM

## 2020-11-27 NOTE — Patient Instructions (Addendum)
Thank you for coming to the office today.  Think about the testosterone discussion  If you want to pursue more evaluation and consider either or - Testosterone or HCG then we can refer to the Endocrinologist   The one I consulted and discussed your case with was Dr Lonzo Cloud in [facility].  It depends more on the circumstances on the source of low testosterone. If it is a pituitary issue in the brain, then hcg would work. Otherwise if the cause of Low T is from his testes, then typically the testosterone is the treatment.   I can submit a referral to her office for the patient to discuss therapy options with them, but as long as he is aware that treatment with BOTH is not recommended and they cannot offer that.    DUE for FASTING BLOOD WORK (no food or drink after midnight before the lab appointment, only water or coffee without cream/sugar on the morning of)  SCHEDULE "Lab Only" visit in the morning at the clinic for lab draw in 1 YEAR  - Make sure Lab Only appointment is at about 1 week before your next appointment, so that results will be available  For Lab Results, once available within 2-3 days of blood draw, you can can log in to MyChart online to view your results and a brief explanation. Also, we can discuss results at next follow-up visit.    Please schedule a Follow-up Appointment to: Return in about 1 year (around 11/27/2021) for 1 year fasting lab only then 1 week later Annual Physical.  If you have any other questions or concerns, please feel free to call the office or send a message through MyChart. You may also schedule an earlier appointment if necessary.  Additionally, you may be receiving a survey about your experience at our office within a few days to 1 week by e-mail or mail. We value your feedback.  Saralyn Pilar, DO [facility], [place]

## 2020-11-27 NOTE — Progress Notes (Signed)
Subjective:    Patient ID: Albert Gonzales, male    DOB: 06/09/1990, 30 y.o.   MRN: [id number]  Albert Gonzales is a 30 y.o. male presenting on 11/27/2020 for Annual Exam   HPI  Here for Annual Physical and Lab Review.  Elevated Creatinine He was taking some Creatine pills for working out gym exercise. Prior Creatinine mild elevated previous lab as well.  HYPERLIPIDEMIA: - Reports concerns elevated LDL. Last lipid panel 11/2020, LDL 112 and TG 309, HDL 36 Lifestyle - Diet: discussed he eats mostly red meats, and has tried carb loading recently. Not eating as much lean meat chicken, fish etc. Goal to inc veggies - Exercise: gym Mon Weds Fri / weekends, 45 min - 1 hour, mostly strength training and some cardio now doing more cardio.  Allergies Environmental / Seasonal He reports having persistent issue with recurrent flare up with allergies, he can have some breathing symptoms associated with flares. Improved on medications Singulair, Loratadine. Not following w/ Allergist.  PMH Low Testosterone See prior visit.      Health Maintenance:  Negative Hep C and HIV Screening today.  No known fam history of cancer.  Due for Flu Shot, will receive today    Depression screen [facility] 2/9 08/30/2020 09/05/2014  Decreased Interest 0 0  Down, Depressed, Hopeless 0 0  PHQ - 2 Score 0 0  Altered sleeping 0 -  Tired, decreased energy 0 -  Change in appetite 0 -  Feeling bad or failure about yourself  0 -  Trouble concentrating 0 -  Moving slowly or fidgety/restless 0 -  Suicidal thoughts 0 -  PHQ-9 Score 0 -  Difficult doing work/chores Not difficult at all -    Past Medical History:  Diagnosis Date   Malignant hyperthermia    pt had arm surgery at [place] in 2014 and was told that he developed malignant hyperthermia during surgery   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   during humerus surgery at [place]   Past Surgical History:  Procedure Laterality Date    HUMERUS FRACTURE SURGERY Right 2014   INGUINAL HERNIA REPAIR Left 09/26/2014   Procedure: LEFT [place] ;  Surgeon: Christene Lye, MD;  Location: [facility] ORS;  Service: General;  Laterality: Left;   MANDIBLE SURGERY Left 03/16/2004   Social History   Socioeconomic History   Marital status: Single    Spouse name: Not on file   Number of children: Not on file   Years of education: Not on file   Highest education level: Not on file  Occupational History   Not on file  Tobacco Use   Smoking status: Never   Smokeless tobacco: Never  Substance and Sexual Activity   Alcohol use: Yes    Alcohol/week: 1.0 standard drink    Types: 1 Cans of beer per week    Comment: Patient states he may drink a beer once a month   Drug use: No   Sexual activity: Yes    Partners: Female    Birth control/protection: Condom  Other Topics Concern   Not on file  Social History Narrative   Not on file   Social Determinants of Health   Financial Resource Strain: Not on file  Food Insecurity: Not on file  Transportation Needs: Not on file  Physical Activity: Not on file  Stress: Not on file  Social Connections: Not on file  Intimate Partner Violence: Not on file   Family History  Problem Relation Age of Onset   Hyperlipidemia Father    Hyperlipidemia Paternal Grandfather    Current Outpatient Medications on File Prior to Visit  Medication Sig   loratadine (CLARITIN) 10 MG tablet Take 1 tablet (10 mg total) by mouth daily. Use for 4-6 weeks then stop, and use as needed or seasonally   montelukast (SINGULAIR) 10 MG tablet Take 1 tablet (10 mg total) by mouth at bedtime.   testosterone cypionate (DEPOTESTOSTERONE CYPIONATE) 200 MG/ML injection Inject 200 mg into the muscle daily.   No current facility-administered medications on file prior to visit.    Review of Systems  Constitutional:  Negative for activity change, appetite change, chills, diaphoresis, fatigue and fever.   HENT:  Negative for congestion and hearing loss.   Eyes:  Negative for visual disturbance.  Respiratory:  Negative for cough, chest tightness, shortness of breath and wheezing.   Cardiovascular:  Negative for chest pain, palpitations and leg swelling.  Gastrointestinal:  Negative for abdominal pain, constipation, diarrhea, nausea and vomiting.  Genitourinary:  Negative for dysuria, frequency and hematuria.  Musculoskeletal:  Negative for arthralgias and neck pain.  Skin:  Negative for rash.  Neurological:  Negative for dizziness, weakness, light-headedness, numbness and headaches.  Hematological:  Negative for adenopathy.  Psychiatric/Behavioral:  Negative for behavioral problems, dysphoric mood and sleep disturbance.   Per HPI unless specifically indicated above      Objective:    BP 135/83   Pulse 65   Ht '6\' 2"'  (1.88 m)   Wt 203 lb (92.1 kg)   SpO2 100%   BMI 26.06 kg/m   Wt Readings from Last 3 Encounters:  11/27/20 203 lb (92.1 kg)  09/11/20 210 lb (95.3 kg)  08/30/20 208 lb 12.8 oz (94.7 kg)    Physical Exam Vitals and nursing note reviewed.  Constitutional:      General: He is not in acute distress.    Appearance: He is well-developed. He is not diaphoretic.     Comments: Well-appearing, comfortable, cooperative  HENT:     Head: Normocephalic and atraumatic.  Eyes:     General:        Right eye: No discharge.        Left eye: No discharge.     Conjunctiva/sclera: Conjunctivae normal.     Pupils: Pupils are equal, round, and reactive to light.  Neck:     Thyroid: No thyromegaly.     Vascular: No carotid bruit.  Cardiovascular:     Rate and Rhythm: Normal rate and regular rhythm.     Pulses: Normal pulses.     Heart sounds: Normal heart sounds. No murmur heard. Pulmonary:     Effort: Pulmonary effort is normal. No respiratory distress.     Breath sounds: Normal breath sounds. No wheezing or rales.  Abdominal:     General: Bowel sounds are normal. There is  no distension.     Palpations: Abdomen is soft. There is no mass.     Tenderness: There is no abdominal tenderness.  Musculoskeletal:        General: No tenderness. Normal range of motion.     Cervical back: Normal range of motion and neck supple.     Right lower leg: No edema.     Left lower leg: No edema.     Comments: Upper / Lower Extremities: - Normal muscle tone, strength bilateral upper extremities 5/5, lower extremities 5/5  Lymphadenopathy:     Cervical: No cervical adenopathy.  Skin:  General: Skin is warm and dry.     Findings: No erythema or rash.  Neurological:     Mental Status: He is alert and oriented to person, place, and time.     Comments: Distal sensation intact to light touch all extremities  Psychiatric:        Mood and Affect: Mood normal.        Behavior: Behavior normal.        Thought Content: Thought content normal.     Comments: Well groomed, good eye contact, normal speech and thoughts     Results for orders placed or performed in visit on 11/19/20  Hepatitis C antibody  Result Value Ref Range   Hepatitis C Ab NON-REACTIVE NON-REACTIVE   SIGNAL TO CUT-OFF 0.01 <1.00  HIV Antibody (routine testing w rflx)  Result Value Ref Range   HIV 1&2 Ab, 4th Generation NON-REACTIVE NON-REACTIVE  Lipid panel  Result Value Ref Range   Cholesterol 189 <200 mg/dL   HDL 36 (L) > OR = 40 mg/dL   Triglycerides 309 (H) <150 mg/dL   LDL Cholesterol (Calc) 112 (H) mg/dL (calc)   Total CHOL/HDL Ratio 5.3 (H) <5.0 (calc)   Non-HDL Cholesterol (Calc) 153 (H) <130 mg/dL (calc)  CBC with Differential/Platelet  Result Value Ref Range   WBC 4.3 3.8 - 10.8 Thousand/uL   RBC 5.31 4.20 - 5.80 Million/uL   Hemoglobin 14.9 13.2 - 17.1 g/dL   HCT 45.1 38.5 - 50.0 %   MCV 84.9 80.0 - 100.0 fL   MCH 28.1 27.0 - 33.0 pg   MCHC 33.0 32.0 - 36.0 g/dL   RDW 13.5 11.0 - 15.0 %   Platelets 252 140 - 400 Thousand/uL   MPV 11.6 7.5 - 12.5 fL   Neutro Abs 1,505 1,500 - 7,800  cells/uL   Lymphs Abs 2,146 850 - 3,900 cells/uL   Absolute Monocytes 520 200 - 950 cells/uL   Eosinophils Absolute 90 15 - 500 cells/uL   Basophils Absolute 39 0 - 200 cells/uL   Neutrophils Relative % 35 %   Total Lymphocyte 49.9 %   Monocytes Relative 12.1 %   Eosinophils Relative 2.1 %   Basophils Relative 0.9 %  COMPLETE METABOLIC PANEL WITH GFR  Result Value Ref Range   Glucose, Bld 82 65 - 99 mg/dL   BUN 22 7 - 25 mg/dL   Creat 1.35 (H) 0.60 - 1.26 mg/dL   eGFR 72 > OR = 60 mL/min/1.62m   BUN/Creatinine Ratio 16 6 - 22 (calc)   Sodium 140 135 - 146 mmol/L   Potassium 4.1 3.5 - 5.3 mmol/L   Chloride 105 98 - 110 mmol/L   CO2 29 20 - 32 mmol/L   Calcium 9.9 8.6 - 10.3 mg/dL   Total Protein 7.4 6.1 - 8.1 g/dL   Albumin 4.5 3.6 - 5.1 g/dL   Globulin 2.9 1.9 - 3.7 g/dL (calc)   AG Ratio 1.6 1.0 - 2.5 (calc)   Total Bilirubin 0.3 0.2 - 1.2 mg/dL   Alkaline phosphatase (APISO) 58 36 - 130 U/L   AST 17 10 - 40 U/L   ALT 27 9 - 46 U/L      Assessment & Plan:   Problem List Items Addressed This Visit   None Visit Diagnoses     Annual physical exam    -  Primary   Needs flu shot       Relevant Orders   Flu Vaccine QUAD 660moM (Fluarix, Fluzone &  Alfiuria Quad PF) (Completed)       Updated Health Maintenance information Reviewed recent lab results with patient Encouraged improvement to lifestyle with diet and exercise Goal of weight loss  Reviewed Cholesterol results Mild elevated LDL HyperTG >300 Counseling on diet lifestyle, likely modifications can manage this currently. Encourage continue exercise and improve diet as discussed.  Mild elevated Creatinine Likely near baseline Limit creatine supplement, improve hydration F/u trend.  Flu Shot today.  Reviewed Low T history - he will reconsider if interested in Endocrinology consultation or return to Urology. Notify us for referral if need.  No orders of the defined types were placed in this  encounter.     Follow up plan: Return in about 1 year (around 11/27/2021) for 1 year fasting lab only then 1 week later Annual Physical.  Nobie Putnam, DO [facility] 11/27/2020, 3:57 PM

## 2020-12-24 ENCOUNTER — Other Ambulatory Visit: Payer: Self-pay

## 2021-09-02 ENCOUNTER — Other Ambulatory Visit: Payer: Self-pay

## 2021-09-23 ENCOUNTER — Encounter: Payer: Self-pay | Admitting: Family Medicine

## 2021-09-23 ENCOUNTER — Ambulatory Visit (INDEPENDENT_AMBULATORY_CARE_PROVIDER_SITE_OTHER): Payer: 59 | Admitting: Family Medicine

## 2021-09-23 ENCOUNTER — Ambulatory Visit: Payer: Self-pay | Admitting: *Deleted

## 2021-09-23 VITALS — BP 127/64 | HR 65 | Ht 74.0 in | Wt 204.2 lb

## 2021-09-23 DIAGNOSIS — M76891 Other specified enthesopathies of right lower limb, excluding foot: Secondary | ICD-10-CM

## 2021-09-23 DIAGNOSIS — M778 Other enthesopathies, not elsewhere classified: Secondary | ICD-10-CM | POA: Diagnosis not present

## 2021-09-23 DIAGNOSIS — M25562 Pain in left knee: Secondary | ICD-10-CM

## 2021-09-23 DIAGNOSIS — M25561 Pain in right knee: Secondary | ICD-10-CM

## 2021-09-23 MED ORDER — MELOXICAM 15 MG PO TABS: 15.0000 mg | ORAL_TABLET | Freq: Every day | ORAL | 0 refills | Status: DC | PRN

## 2021-09-23 MED ORDER — CYCLOBENZAPRINE HCL 10 MG PO TABS
5.0000 mg | ORAL_TABLET | Freq: Every evening | ORAL | 0 refills | Status: DC | PRN
Start: 2021-09-23 — End: 2022-12-07

## 2021-09-23 NOTE — Progress Notes (Addendum)
Subjective:    Patient ID: Albert Gonzales, male    DOB: 1990/05/22, 30 y.o.   MRN: [id number]  Albert Gonzales is a 31 y.o. male presenting on 09/23/2021 for Knee Pain and Elbow Pain   HPI  Bilateral Knee Pain Medial Reports symptoms started 2 months ago with bilateral knees with some pain and swelling, Right knee started first with symptoms then Left begin. Admits some clicking and popping with knees when straightens them and moves them regularly. - New exercise with boxing, has been having difficulty with lateral movements - He does a lot of stairs repetitively at work - [employer], compression sleeves - Taking Ibuprofen 215m x 3 = 601monce daily No prior knee injuries or issues with knees No trauma or fall to knees  Left Elbow, pain Reports similar time 2 months, with Left elbow pain without swelling. He has been doing boxing regimen, and using his left arm a lot for swinging punching but he is Right hand dominant. He admits leaning on L elbow a lot during day and at home but no other repetitive activities. No trauma or injuries      08/30/2020   10:12 AM 09/05/2014   12:00 PM  Depression screen PHQ 2/9  Decreased Interest 0 0  Down, Depressed, Hopeless 0 0  PHQ - 2 Score 0 0  Altered sleeping 0   Tired, decreased energy 0   Change in appetite 0   Feeling bad or failure about yourself  0   Trouble concentrating 0   Moving slowly or fidgety/restless 0   Suicidal thoughts 0   PHQ-9 Score 0   Difficult doing work/chores Not difficult at all     Social History   Tobacco Use   Smoking status: Never   Smokeless tobacco: Never  Substance Use Topics   Alcohol use: Yes    Alcohol/week: 1.0 standard drink of alcohol    Types: 1 Cans of beer per week    Comment: Patient states he may drink a beer once a month   Drug use: No    Review of Systems Per HPI unless specifically indicated above     Objective:    BP 127/64   Pulse 65   Ht _0  (1.88 m)   Wt  204 lb 3.2 oz (92.6 kg)   SpO2 98%   BMI 26.22 kg/m   Wt Readings from Last 3 Encounters:  09/23/21 204 lb 3.2 oz (92.6 kg)  11/27/20 203 lb (92.1 kg)  09/11/20 210 lb (95.3 kg)    Physical Exam Vitals and nursing note reviewed.  Constitutional:      General: He is not in acute distress.    Appearance: Normal appearance. He is well-developed. He is not diaphoretic.     Comments: Well-appearing, comfortable, cooperative  HENT:     Head: Normocephalic and atraumatic.  Eyes:     General:        Right eye: No discharge.        Left eye: No discharge.     Conjunctiva/sclera: Conjunctivae normal.  Cardiovascular:     Rate and Rhythm: Normal rate.  Pulmonary:     Effort: Pulmonary effort is normal.  Musculoskeletal:     Right lower leg: No edema.     Left lower leg: No edema.     Comments: Bilateral Knees Inspection: Normal appearance and symmetrical. No ecchymosis or effusion. Palpation: Mild medial joint line tenderness R>L bilateral. No deformity. Has some audible  and palpable crepitus on exam R>L ROM: Full active ROM bilaterally Special Testing: Lachman / Valgus/Varus tests negative with intact ligaments (ACL, MCL, LCL). Standing Thessaly positive bilateral R>L Strength: 5/5 intact knee flex/ext, ankle dorsi/plantarflex Neurovascular: distally intact sensation light touch and pulses  L elbow without effusion. Has full range of motion no bony tender   Skin:    General: Skin is warm and dry.     Findings: No erythema or rash.  Neurological:     Mental Status: He is alert and oriented to person, place, and time.  Psychiatric:        Mood and Affect: Mood normal.        Behavior: Behavior normal.        Thought Content: Thought content normal.     Comments: Well groomed, good eye contact, normal speech and thoughts    Results for orders placed or performed in visit on 11/19/20  Hepatitis C antibody  Result Value Ref Range   Hepatitis C Ab NON-REACTIVE NON-REACTIVE    SIGNAL TO CUT-OFF 0.01 <1.00  HIV Antibody (routine testing w rflx)  Result Value Ref Range   HIV 1&2 Ab, 4th Generation NON-REACTIVE NON-REACTIVE  Lipid panel  Result Value Ref Range   Cholesterol 189 <200 mg/dL   HDL 36 (L) > OR = 40 mg/dL   Triglycerides 309 (H) <150 mg/dL   LDL Cholesterol (Calc) 112 (H) mg/dL (calc)   Total CHOL/HDL Ratio 5.3 (H) <5.0 (calc)   Non-HDL Cholesterol (Calc) 153 (H) <130 mg/dL (calc)  CBC with Differential/Platelet  Result Value Ref Range   WBC 4.3 3.8 - 10.8 Thousand/uL   RBC 5.31 4.20 - 5.80 Million/uL   Hemoglobin 14.9 13.2 - 17.1 g/dL   HCT 45.1 38.5 - 50.0 %   MCV 84.9 80.0 - 100.0 fL   MCH 28.1 27.0 - 33.0 pg   MCHC 33.0 32.0 - 36.0 g/dL   RDW 13.5 11.0 - 15.0 %   Platelets 252 140 - 400 Thousand/uL   MPV 11.6 7.5 - 12.5 fL   Neutro Abs 1,505 1,500 - 7,800 cells/uL   Lymphs Abs 2,146 850 - 3,900 cells/uL   Absolute Monocytes 520 200 - 950 cells/uL   Eosinophils Absolute 90 15 - 500 cells/uL   Basophils Absolute 39 0 - 200 cells/uL   Neutrophils Relative % 35 %   Total Lymphocyte 49.9 %   Monocytes Relative 12.1 %   Eosinophils Relative 2.1 %   Basophils Relative 0.9 %  COMPLETE METABOLIC PANEL WITH GFR  Result Value Ref Range   Glucose, Bld 82 65 - 99 mg/dL   BUN 22 7 - 25 mg/dL   Creat 1.35 (H) 0.60 - 1.26 mg/dL   eGFR 72 > OR = 60 mL/min/1.46m   BUN/Creatinine Ratio 16 6 - 22 (calc)   Sodium 140 135 - 146 mmol/L   Potassium 4.1 3.5 - 5.3 mmol/L   Chloride 105 98 - 110 mmol/L   CO2 29 20 - 32 mmol/L   Calcium 9.9 8.6 - 10.3 mg/dL   Total Protein 7.4 6.1 - 8.1 g/dL   Albumin 4.5 3.6 - 5.1 g/dL   Globulin 2.9 1.9 - 3.7 g/dL (calc)   AG Ratio 1.6 1.0 - 2.5 (calc)   Total Bilirubin 0.3 0.2 - 1.2 mg/dL   Alkaline phosphatase (APISO) 58 36 - 130 U/L   AST 17 10 - 40 U/L   ALT 27 9 - 46 U/L      Assessment &  Plan:   Problem List Items Addressed This Visit   None Visit Diagnoses     Acute pain of both knees    -  Primary    Relevant Medications   meloxicam (MOBIC) 15 MG tablet   cyclobenzaprine (FLEXERIL) 10 MG tablet   Medial knee pain, right       Relevant Medications   meloxicam (MOBIC) 15 MG tablet   cyclobenzaprine (FLEXERIL) 10 MG tablet   Medial knee pain, left       Relevant Medications   meloxicam (MOBIC) 15 MG tablet   cyclobenzaprine (FLEXERIL) 10 MG tablet   Tendonitis of knee, right       Left elbow tendonitis           Subacute R>L  medial Knee pain and swelling without known injury or trauma Likely cause with inc repetitive activity with boxing training exercise regimen No known OA/DJD history No trauma Concern for meniscus strain vs tendonitis with overuse repetitive Exam suggestive of possible meniscus issue No instability or evidence of acute tear of ligament / meniscus on exam - No prior history of knee surgery, arthroscopy  Plan: 1. Start anti-inflammatory trial with rx Meloxicam 27m daily wc 2-4 weeks then PRN 2. Start Tylenol 500-10031mper dose TID PRN breakthrough - Start muscle relaxant with Flexeril 1071mabs - take 5-60m65mS PRN caution sedation 3. RICE therapy (rest, ice, compression, elevation) for swelling, activity modification. May try Knee Braces for high impact activity 4. Defer Knee X-rays based on symptoms and mechanism 5. Follow-up 2 - 4 weeks, if still worsening, consider Sports Med vs Imaging  L elbow, same as above, likely repetitive overuse, tendonitis No obvious deformity or injury  Overall rest injuries, limit boxing exercising or provoking activities.   Meds ordered this encounter  Medications   meloxicam (MOBIC) 15 MG tablet    Sig: Take 1 tablet (15 mg total) by mouth daily as needed for pain.    Dispense:  30 tablet    Refill:  0   cyclobenzaprine (FLEXERIL) 10 MG tablet    Sig: Take 0.5-1 tablets (5-10 mg total) by mouth at bedtime as needed for muscle spasms.    Dispense:  30 tablet    Refill:  0      Follow up plan: Return in  about 4 weeks (around 10/21/2021), or if symptoms worsen or fail to improve, for 4 week follow up knee elbow pain if not improve.   AlexNobie Putnam [facility] 09/23/2021, 2:42 PM

## 2021-09-23 NOTE — Telephone Encounter (Signed)
Per agent: "Patient states he has swelling and pain in both knees and lt elbow x22m   Please fu w/ patient "   Chief Complaint: Knee pain, elbow pain Symptoms: Both knees 6/10 pain, swelling initially, not presently. Worse with bending knees, "Stairs very painful, any bending." Left elbow pain with straightening. Started with training, boxing gym. Frequency: 2 months Pertinent Negatives: Patient denies redness, warmth Disposition: [] ED /[] Urgent Care (no appt availability in office) / [x] Appointment(In office/virtual)/ []  [place]/ [] Home Care/ [] Refused Recommended Disposition /[] [facility]/ []  Follow-up with PCP Additional Notes: Care advise provided, pt verbalizes understanding. Reason for Disposition  [1] MODERATE pain (e.g., interferes with normal activities, limping) AND [2] present > 3 days  Answer Assessment - Initial Assessment Questions 1. LOCATION and RADIATION: "Where is the pain located?"      Both knees, left elbow 2. QUALITY: "What does the pain feel like?"  (e.g., sharp, dull, aching, burning)     With movement "Throbbing"  Bending motion. Elbow with straightening 3. SEVERITY: "How bad is the pain?" "What does it keep you from doing?"   (Scale 1-10; or mild, moderate, severe)   -  MILD (1-3): doesn't interfere with normal activities    -  MODERATE (4-7): interferes with normal activities (e.g., work or school) or awakens from sleep, limping    -  SEVERE (8-10): excruciating pain, unable to do any normal activities, unable to walk     6/10 4. ONSET: "When did the pain start?" "Does it come and go, or is it there all the time?"     2 months ago 5. RECURRENT: "Have you had this pain before?" If Yes, ask: "When, and what happened then?"     No in knees. Elbow "Had tennis elbow." 6. SETTING: "Has there been any recent work, exercise or other activity that involved that part of the body?"      Boxing, training. Started about that time 7. AGGRAVATING  FACTORS: "What makes the knee pain worse?" (e.g., walking, climbing stairs, running)     Stairs 8. ASSOCIATED SYMPTOMS: "Is there any swelling or redness of the knee?"     None 9. OTHER SYMPTOMS: "Do you have any other symptoms?" (e.g., chest pain, difficulty breathing, fever, calf pain)     Swelling x 2 weeks, not presently  Protocols used: Knee Pain-A-AH

## 2021-09-23 NOTE — Patient Instructions (Addendum)
Thank you for coming to the office today.  You most likely have a Bilateral knee vs meniscus sprain - this is an injury to the ligaments supporting the knee, it can be a minor injury or small tear, rarely it can turn out to be a larger tear to the ligament if it does not improve, we cannot rule this out.   Also I am concerned about the possibility of a meniscus injury (the cartilage shock absorber within the knee)  No X-ray today we can reconsider in future  - I recommend a Knee Brace to support your knee and limit motion of the knee to help it heal, avoid excess weight bearing and repetitive activities on it  Recommend trial of Anti-inflammatory with Meloxicam 15mg  tabs - take one with food and plenty of water daily every day (breakfast), for next 2 to 4 weeks, then you may take only as needed - DO NOT TAKE any ibuprofen, advil, aleve, motrin while you are taking this medicine - It is safe to take Tylenol Ext Str 500mg  tabs - take 1 to 2 (max dose 1000mg ) every 6 hours as needed for breakthrough pain, max 24 hour daily dose is 6 to 8 tablets or 4000mg   If phasing OFF Meloxicam anti inflammatory pill can do the topical version START anti inflammatory topical - OTC Voltaren (generic Diclofenac) topical 2-4 times a day as needed for pain swelling of affected joint for 1-2 weeks or longer.   Use RICE therapy: - R - Rest / relative rest with activity modification avoid overuse and frequent bending or pressure on bent knee - I - Ice packs (make sure you use a towel or sock / something to protect skin) - C - Compression with ACE wrap or immobilizer apply pressure and reduce swelling allowing more support - E - Elevation - if significant swelling, lift leg above heart level (toes above your nose) to help reduce swelling, most helpful at night after day of being on your feet   If not improving within 2-4 weeks we can refer to Sports medicine  [facility] Sports Medicine  Dr  [facility] [address] [place], [place] [facility] [phone]  ---------------------------  If severe or not improving can go to Ortho Urgent Care  [facility] ([facility] Address: [address] [place], [place], [facility] [address] Hours:  9AM-5PM Phone: [phone]  Orthopedics should also have an Urgent Care Walk In Availability for after hours.   Please schedule a Follow-up Appointment to: Return in about 4 weeks (around 10/21/2021), or if symptoms worsen or fail to improve, for 4 week follow up knee elbow pain if not improve.  If you have any other questions or concerns, please feel free to call the office or send a message through MyChart. You may also schedule an earlier appointment if necessary.  Additionally, you may be receiving a survey about your experience at our office within a few days to 1 week by e-mail or mail. We value your feedback.  [place], DO [facility], [phone]

## 2021-10-21 ENCOUNTER — Other Ambulatory Visit: Payer: Self-pay | Admitting: Family Medicine

## 2021-10-21 DIAGNOSIS — M25562 Pain in left knee: Secondary | ICD-10-CM

## 2021-10-21 DIAGNOSIS — M25561 Pain in right knee: Secondary | ICD-10-CM

## 2021-10-21 NOTE — Telephone Encounter (Signed)
Requested Prescriptions  Pending Prescriptions Disp Refills  . meloxicam (MOBIC) 15 MG tablet [Pharmacy Med Name: MELOXICAM 15MG TABLETS] 30 tablet 0    Sig: TAKE 1 TABLET(15 MG) BY MOUTH DAILY AS NEEDED FOR PAIN     Analgesics:  COX2 Inhibitors Failed - 10/21/2021  3:22 AM      Failed - Manual Review: Labs are only required if the patient has taken medication for more than 8 weeks.      Failed - Cr in normal range and within 360 days    Creat  Date Value Ref Range Status  11/20/2020 1.35 (H) 0.60 - 1.26 mg/dL Final         Passed - HGB in normal range and within 360 days    Hemoglobin  Date Value Ref Range Status  11/20/2020 14.9 13.2 - 17.1 g/dL Final         Passed - HCT in normal range and within 360 days    HCT  Date Value Ref Range Status  11/20/2020 45.1 38.5 - 50.0 % Final         Passed - AST in normal range and within 360 days    AST  Date Value Ref Range Status  11/20/2020 17 10 - 40 U/L Final         Passed - ALT in normal range and within 360 days    ALT  Date Value Ref Range Status  11/20/2020 27 9 - 46 U/L Final         Passed - eGFR is 30 or above and within 360 days    GFR calc Af Amer  Date Value Ref Range Status  06/22/2016 >60 >60 mL/min Final    Comment:    (NOTE) The eGFR has been calculated using the CKD EPI equation. This calculation has not been validated in all clinical situations. eGFR's persistently <60 mL/min signify possible Chronic Kidney Disease.    GFR calc non Af Amer  Date Value Ref Range Status  06/22/2016 >60 >60 mL/min Final   eGFR  Date Value Ref Range Status  11/20/2020 72 > OR = 60 mL/min/1.46m Final    Comment:    The eGFR is based on the CKD-EPI 2021 equation. To calculate  the new eGFR from a previous Creatinine or Cystatin C result, go to https://www.kidney.org/professionals/ kdoqi/gfr%5Fcalculator          Passed - Patient is not pregnant      Passed - Valid encounter within last 12 months    Recent  Outpatient Visits          4 weeks ago Acute pain of both [place]   10 months ago [facility]   1 year ago Environmental and seasonal allergies   [facility] DO   7 years ago [facility]      Future Appointments            In 1 month [facility]

## 2021-11-24 ENCOUNTER — Other Ambulatory Visit: Payer: Self-pay

## 2021-11-24 DIAGNOSIS — Z0000 Encounter for general adult medical examination without abnormal findings: Secondary | ICD-10-CM

## 2021-11-24 DIAGNOSIS — E785 Hyperlipidemia, unspecified: Secondary | ICD-10-CM

## 2021-11-25 ENCOUNTER — Other Ambulatory Visit: Payer: 59

## 2021-11-26 LAB — CBC WITH DIFFERENTIAL/PLATELET
Absolute Monocytes: 939 cells/uL (ref 200–950)
Basophils Absolute: 21 cells/uL (ref 0–200)
Basophils Relative: 0.5 %
Eosinophils Absolute: 62 cells/uL (ref 15–500)
Eosinophils Relative: 1.5 %
HCT: 43.7 % (ref 38.5–50.0)
Hemoglobin: 14.9 g/dL (ref 13.2–17.1)
Lymphs Abs: 631 cells/uL — ABNORMAL LOW (ref 850–3900)
MCH: 29.7 pg (ref 27.0–33.0)
MCHC: 34.1 g/dL (ref 32.0–36.0)
MCV: 87.1 fL (ref 80.0–100.0)
MPV: 10.7 fL (ref 7.5–12.5)
Monocytes Relative: 22.9 %
Neutro Abs: 2448 cells/uL (ref 1500–7800)
Neutrophils Relative %: 59.7 %
Platelets: 200 10*3/uL (ref 140–400)
RBC: 5.02 10*6/uL (ref 4.20–5.80)
RDW: 13.6 % (ref 11.0–15.0)
Total Lymphocyte: 15.4 %
WBC: 4.1 10*3/uL (ref 3.8–10.8)

## 2021-11-26 LAB — COMPLETE METABOLIC PANEL WITH GFR
AG Ratio: 1.4 (calc) (ref 1.0–2.5)
ALT: 19 U/L (ref 9–46)
AST: 19 U/L (ref 10–40)
Albumin: 4.4 g/dL (ref 3.6–5.1)
Alkaline phosphatase (APISO): 74 U/L (ref 36–130)
BUN/Creatinine Ratio: 11 (calc) (ref 6–22)
BUN: 14 mg/dL (ref 7–25)
CO2: 27 mmol/L (ref 20–32)
Calcium: 9.4 mg/dL (ref 8.6–10.3)
Chloride: 104 mmol/L (ref 98–110)
Creat: 1.31 mg/dL — ABNORMAL HIGH (ref 0.60–1.26)
Globulin: 3.2 g/dL (calc) (ref 1.9–3.7)
Glucose, Bld: 97 mg/dL (ref 65–99)
Potassium: 4.1 mmol/L (ref 3.5–5.3)
Sodium: 140 mmol/L (ref 135–146)
Total Bilirubin: 0.6 mg/dL (ref 0.2–1.2)
Total Protein: 7.6 g/dL (ref 6.1–8.1)
eGFR: 74 mL/min/{1.73_m2} (ref >=60)

## 2021-11-26 LAB — LIPID PANEL
Cholesterol: 176 mg/dL (ref <200)
HDL: 49 mg/dL (ref >=40)
LDL Cholesterol (Calc): 112 mg/dL (calc) — ABNORMAL HIGH
Non-HDL Cholesterol (Calc): 127 mg/dL (calc) (ref <130)
Total CHOL/HDL Ratio: 3.6 (calc) (ref <5.0)
Triglycerides: 65 mg/dL (ref <150)

## 2021-11-26 LAB — TSH: TSH: 0.51 mIU/L (ref 0.40–4.50)

## 2021-12-02 ENCOUNTER — Encounter: Payer: Self-pay | Admitting: Physician Assistant

## 2021-12-02 ENCOUNTER — Ambulatory Visit (INDEPENDENT_AMBULATORY_CARE_PROVIDER_SITE_OTHER): Payer: 59 | Admitting: Physician Assistant

## 2021-12-02 ENCOUNTER — Encounter: Payer: 59 | Admitting: Family Medicine

## 2021-12-02 VITALS — BP 116/66 | HR 93 | Ht 74.0 in | Wt 201.4 lb

## 2021-12-02 DIAGNOSIS — Z0000 Encounter for general adult medical examination without abnormal findings: Secondary | ICD-10-CM | POA: Diagnosis not present

## 2021-12-02 DIAGNOSIS — M778 Other enthesopathies, not elsewhere classified: Secondary | ICD-10-CM

## 2021-12-02 NOTE — Progress Notes (Signed)
Annual Physical Exam  Name: Albert Gonzales   MRN: [id number]    DOB: [birth year]   Date:12/02/2021 Today's Provider: Talitha Givens, MHS, PA-C Introduced myself to the patient as a PA-C and provided education on APPs in clinical practice.         Subjective  Chief Complaint  Chief Complaint  Patient presents with   Annual Exam    HPI  Patient presents for annual CPE .  Diet: Normal diet, balanced with protein, carbs, fats Exercise: He is boxing 3 times per week for 1 hour each session  Sleep:"it's good" getting 4-5 hours per night- he has a newborn at home. Feels semi-well rested in the AM Mood:"it's pretty good"    Left elbow pain has not improved since previous visit Reports he has tried to reduce impact in boxing and has tried to mindful of leaning on it or overusing States pain has not declined since previous visit He is still taking medications sent in by PCP Reports pain is worst with full extension  Bilateral knee pain is improved with medication and recommendations from PCP    Depression: phq 9 is negative    08/30/2020   10:12 AM 09/05/2014   12:00 PM  Depression screen PHQ 2/9  Decreased Interest 0 0  Down, Depressed, Hopeless 0 0  PHQ - 2 Score 0 0  Altered sleeping 0   Tired, decreased energy 0   Change in appetite 0   Feeling bad or failure about yourself  0   Trouble concentrating 0   Moving slowly or fidgety/restless 0   Suicidal thoughts 0   PHQ-9 Score 0   Difficult doing work/chores Not difficult at all     Hypertension:  BP Readings from Last 3 Encounters:  12/02/21 116/66  09/23/21 127/64  11/27/20 135/83    Obesity: Wt Readings from Last 3 Encounters:  12/02/21 201 lb 6.4 oz (91.4 kg)  09/23/21 204 lb 3.2 oz (92.6 kg)  11/27/20 203 lb (92.1 kg)   BMI Readings from Last 3 Encounters:  12/02/21 25.86 kg/m  09/23/21 26.22 kg/m  11/27/20 26.06 kg/m     Lipids:  Lab Results  Component Value Date   CHOL 176 11/25/2021    CHOL 189 11/20/2020   Lab Results  Component Value Date   HDL 49 11/25/2021   HDL 36 (L) 11/20/2020   Lab Results  Component Value Date   LDLCALC 112 (H) 11/25/2021   LDLCALC 112 (H) 11/20/2020   Lab Results  Component Value Date   TRIG 65 11/25/2021   TRIG 309 (H) 11/20/2020   Lab Results  Component Value Date   CHOLHDL 3.6 11/25/2021   CHOLHDL 5.3 (H) 11/20/2020   No results found for: "LDLDIRECT" Glucose:  Glucose, Bld  Date Value Ref Range Status  11/25/2021 97 65 - 99 mg/dL Final    Comment:    .            Fasting reference interval .   11/20/2020 82 65 - 99 mg/dL Final    Comment:    .            Fasting reference interval .   06/22/2016 90 65 - 99 mg/dL Final       Single STD testing and prevention (HIV/chl/gon/syphilis):  no Hep C Screening: completed in 2022 Skin cancer: Discussed monitoring for atypical lesions Colorectal cancer: Not due yet - reviewed family hx and no known  colorectal cancer in close relative to necessitate early screening  Prostate cancer:  not applicable No results found for: "PSA"   Lung cancer:  Low Dose CT Chest recommended if Age 33-80 years, 30 pack-year currently smoking OR have quit w/in 15years. Patient  not applicable AAA: The USPSTF recommends one-time screening with ultrasonography in men ages 33 to 55 years who have ever smoked. Patient:  not applicable ECG:  NA  Vaccines:   HPV: aged out  Tdap: Up to date, next due in 2026 Shingrix: NA Pneumonia: NA Flu: Due in the next few weeks to provide coverage for upcoming flu season COVID-19: has received 2 vaccines previously. Recommend obtaining new booster to provide additional coverage  Advanced Care Planning: A voluntary discussion about advance care planning including the explanation and discussion of advance directives.  Discussed health care proxy and Living will, and the patient was able to identify a health care proxy as no one.  Patient does not have a  living will in effect.   Patient Active Problem List   Diagnosis Date Noted   Left elbow tendonitis 12/02/2021   Environmental and seasonal allergies 08/30/2020   Chronic midline low back pain without sciatica 08/30/2020   Low testosterone in male 08/30/2020    Past Surgical History:  Procedure Laterality Date   HUMERUS FRACTURE SURGERY Right 2014   INGUINAL HERNIA REPAIR Left 09/26/2014   Procedure: LEFT INGUINAL HERNIA REPAIR ;  Surgeon: Christene Lye, MD;  Location: [facility] ORS;  Service: General;  Laterality: Left;   MANDIBLE SURGERY Left 03/16/2004    Family History  Problem Relation Age of Onset   Hyperlipidemia Father    Hyperlipidemia Paternal Grandfather     Social History   Socioeconomic History   Marital status: Single    Spouse name: Not on file   Number of children: Not on file   Years of education: Not on file   Highest education level: Not on file  Occupational History   Not on file  Tobacco Use   Smoking status: Never   Smokeless tobacco: Never  Substance and Sexual Activity   Alcohol use: Yes    Alcohol/week: 1.0 standard drink of alcohol    Types: 1 Cans of beer per week    Comment: Patient states he may drink a beer once a month   Drug use: No   Sexual activity: Yes    Partners: Female    Birth control/protection: Condom  Other Topics Concern   Not on file  Social History Narrative   Not on file   Social Determinants of Health   Financial Resource Strain: Not on file  Food Insecurity: Not on file  Transportation Needs: Not on file  Physical Activity: Not on file  Stress: Not on file  Social Connections: Not on file  Intimate Partner Violence: Not on file     Current Outpatient Medications:    cyclobenzaprine (FLEXERIL) 10 MG tablet, Take 0.5-1 tablets (5-10 mg total) by mouth at bedtime as needed for muscle spasms., Disp: 30 tablet, Rfl: 0   loratadine (CLARITIN) 10 MG tablet, Take 1 tablet (10 mg total) by mouth daily. Use for  4-6 weeks then stop, and use as needed or seasonally, Disp: 90 tablet, Rfl: 3   meloxicam (MOBIC) 15 MG tablet, TAKE 1 TABLET(15 MG) BY MOUTH DAILY AS NEEDED FOR PAIN, Disp: 30 tablet, Rfl: 0   montelukast (SINGULAIR) 10 MG tablet, Take 1 tablet (10 mg total) by mouth at bedtime., Disp: 90  tablet, Rfl: 3  No Known Allergies   Review of Systems  Constitutional:  Negative for chills, fever, malaise/fatigue and weight loss.  HENT:  Positive for congestion (has hx of allergies). Negative for hearing loss, sore throat and tinnitus.   Eyes:  Negative for blurred vision, double vision, photophobia, pain and discharge.  Respiratory:  Negative for cough, shortness of breath and wheezing.   Cardiovascular:  Negative for chest pain, palpitations and leg swelling.  Gastrointestinal:  Negative for blood in stool, constipation, diarrhea, heartburn, nausea and vomiting.  Genitourinary:  Negative for dysuria, frequency and urgency.  Musculoskeletal:  Positive for joint pain (reports bilateral knee pain, improving since last PCP visit (4/10 pain)). Negative for myalgias.  Skin:  Negative for itching and rash.  Neurological:  Negative for dizziness, tingling, tremors, weakness and headaches.  Psychiatric/Behavioral:  Negative for depression, memory loss, substance abuse and suicidal ideas. The patient is not nervous/anxious.       Objective  Vitals:   12/02/21 1558  BP: 116/66  Pulse: 93  SpO2: 97%  Weight: 201 lb 6.4 oz (91.4 kg)  Height: '6\' 2"'  (1.88 m)    Body mass index is 25.86 kg/m.  Physical Exam Vitals reviewed.  Constitutional:      General: He is awake.     Appearance: Normal appearance. He is well-developed, well-groomed and normal weight.  HENT:     Head: Normocephalic and atraumatic.     Right Ear: Tympanic membrane, ear canal and external ear normal.     Left Ear: Tympanic membrane, ear canal and external ear normal.     Nose: Nose normal.     Mouth/Throat:     Mouth:  Mucous membranes are moist.     Pharynx: Oropharynx is clear. No oropharyngeal exudate or posterior oropharyngeal erythema.  Eyes:     General: Lids are normal. Gaze aligned appropriately.     Extraocular Movements: Extraocular movements intact.     Right eye: Normal extraocular motion and no nystagmus.     Left eye: Normal extraocular motion and no nystagmus.     Conjunctiva/sclera: Conjunctivae normal.     Pupils: Pupils are equal, round, and reactive to light.  Cardiovascular:     Rate and Rhythm: Normal rate and regular rhythm.     Pulses: Normal pulses.          Radial pulses are 2+ on the right side and 2+ on the left side.     Heart sounds: Normal heart sounds. No murmur heard.    No friction rub. No gallop.  Pulmonary:     Effort: Pulmonary effort is normal.     Breath sounds: Normal breath sounds. No decreased air movement. No decreased breath sounds, wheezing, rhonchi or rales.  Abdominal:     General: Abdomen is flat. Bowel sounds are normal.     Palpations: Abdomen is soft.     Tenderness: There is no abdominal tenderness.  Musculoskeletal:     Right elbow: Normal range of motion.     Left elbow: No swelling, deformity or effusion. Normal range of motion. Tenderness present in olecranon process.     Right lower leg: No edema.     Left lower leg: No edema.  Lymphadenopathy:     Head:     Right side of head: No submental, submandibular or preauricular adenopathy.     Left side of head: No submental, submandibular or preauricular adenopathy.     Upper Body:     Right upper  body: No supraclavicular adenopathy.     Left upper body: No supraclavicular adenopathy.  Neurological:     General: No focal deficit present.     Mental Status: He is alert and oriented to person, place, and time.     GCS: GCS eye subscore is 4. GCS verbal subscore is 5. GCS motor subscore is 6.     Cranial Nerves: No dysarthria or facial asymmetry.     Motor: No weakness, tremor, atrophy or  abnormal muscle tone.  Psychiatric:        Attention and Perception: Attention and perception normal.        Mood and Affect: Mood and affect normal.        Speech: Speech normal.        Behavior: Behavior normal. Behavior is cooperative.        Thought Content: Thought content normal. Thought content does not include suicidal plan.     Recent Results (from the past 2160 hour(s))  TSH     Status: None   Collection Time: 11/25/21  8:05 AM  Result Value Ref Range   TSH 0.51 0.40 - 4.50 mIU/L  CBC with Differential/Platelet     Status: Abnormal   Collection Time: 11/25/21  8:05 AM  Result Value Ref Range   WBC 4.1 3.8 - 10.8 Thousand/uL   RBC 5.02 4.20 - 5.80 Million/uL   Hemoglobin 14.9 13.2 - 17.1 g/dL   HCT 43.7 38.5 - 50.0 %   MCV 87.1 80.0 - 100.0 fL   MCH 29.7 27.0 - 33.0 pg   MCHC 34.1 32.0 - 36.0 g/dL   RDW 13.6 11.0 - 15.0 %   Platelets 200 140 - 400 Thousand/uL   MPV 10.7 7.5 - 12.5 fL   Neutro Abs 2,448 1,500 - 7,800 cells/uL   Lymphs Abs 631 (L) 850 - 3,900 cells/uL   Absolute Monocytes 939 200 - 950 cells/uL   Eosinophils Absolute 62 15 - 500 cells/uL   Basophils Absolute 21 0 - 200 cells/uL   Neutrophils Relative % 59.7 %   Total Lymphocyte 15.4 %   Monocytes Relative 22.9 %   Eosinophils Relative 1.5 %   Basophils Relative 0.5 %  Lipid panel     Status: Abnormal   Collection Time: 11/25/21  8:05 AM  Result Value Ref Range   Cholesterol 176 <200 mg/dL   HDL 49 > OR = 40 mg/dL   Triglycerides 65 <150 mg/dL   LDL Cholesterol (Calc) 112 (H) mg/dL (calc)    Comment: Reference range: <100 . Desirable range <100 mg/dL for primary prevention;   <70 mg/dL for patients with CHD or diabetic patients  with > or = 2 CHD risk factors. Marland Kitchen LDL-C is now calculated using the Martin-Hopkins  calculation, which is a validated novel method providing  better accuracy than the Friedewald equation in the  estimation of LDL-C.  Cresenciano Genre et al. Annamaria Helling. 7939;030(09): 2061-2068   (http://education.[facility].com/faq/FAQ164)    Total CHOL/HDL Ratio 3.6 <5.0 (calc)   Non-HDL Cholesterol (Calc) 127 <130 mg/dL (calc)    Comment: For patients with diabetes plus 1 major ASCVD risk  factor, treating to a non-HDL-C goal of <100 mg/dL  (LDL-C of <70 mg/dL) is considered a therapeutic  option.   COMPLETE METABOLIC PANEL WITH GFR     Status: Abnormal   Collection Time: 11/25/21  8:05 AM  Result Value Ref Range   Glucose, Bld 97 65 - 99 mg/dL    Comment: .  Fasting reference interval .    BUN 14 7 - 25 mg/dL   Creat 1.31 (H) 0.60 - 1.26 mg/dL   eGFR 74 > OR = 60 mL/min/1.74m   BUN/Creatinine Ratio 11 6 - 22 (calc)   Sodium 140 135 - 146 mmol/L   Potassium 4.1 3.5 - 5.3 mmol/L   Chloride 104 98 - 110 mmol/L   CO2 27 20 - 32 mmol/L   Calcium 9.4 8.6 - 10.3 mg/dL   Total Protein 7.6 6.1 - 8.1 g/dL   Albumin 4.4 3.6 - 5.1 g/dL   Globulin 3.2 1.9 - 3.7 g/dL (calc)   AG Ratio 1.4 1.0 - 2.5 (calc)   Total Bilirubin 0.6 0.2 - 1.2 mg/dL   Alkaline phosphatase (APISO) 74 36 - 130 U/L   AST 19 10 - 40 U/L   ALT 19 9 - 46 U/L     Fall Risk:    08/30/2020   10:12 AM 09/05/2014   12:00 PM  [place] past [place] 0 No  Number falls in past yr: 0   Injury with Fall? 0   Follow up Falls evaluation completed      Functional Status Survey:      Assessment & Plan  Problem List Items Addressed This Visit       Musculoskeletal and Integument   Left elbow tendonitis    Ongoing concern Reviewed previous visit notes and recommendations Patient reports he would like referral to Sports Med as discussed with PCP Will place referral today Recommend he follow up with PCP after meeting with sports med to further collaboration Continue medications as directed by PCP for management- recommend he remain mindful of overuse, reduce impacts and overuse in boxing      Relevant Orders   Ambulatory referral to Sports Medicine   Other Visit  Diagnoses     Annual physical exam    -  Primary  -Prostate cancer screening and PSA options (with potential risks and benefits of testing vs not testing) were discussed along with recent recs/guidelines. -USPSTF grade A and B recommendations reviewed with patient; age-appropriate recommendations, preventive care, screening tests, etc discussed and encouraged; healthy living encouraged; see AVS for patient education given to patient -Discussed importance of 150 minutes of physical activity weekly, eat two servings of fish weekly, eat one serving of tree nuts ( cashews, pistachios, pecans, almonds..Marland Kitchen every other day, eat 6 servings of fruit/vegetables daily and drink plenty of water and avoid sweet beverages.  -Reviewed Health Maintenance: yes        Return in about 1 year (around 12/03/2022) for annual physical .   I, Agness Sibrian E Lynae Pederson, PA-C, have reviewed all documentation for this visit. The documentation on 12/02/21 for the exam, diagnosis, procedures, and orders are all accurate and complete.   ETalitha Givens MHS, PA-C [facility]

## 2021-12-02 NOTE — Assessment & Plan Note (Signed)
Ongoing concern Reviewed previous visit notes and recommendations Patient reports he would like referral to Sports Med as discussed with PCP Will place referral today Recommend he follow up with PCP after meeting with sports med to further collaboration Continue medications as directed by PCP for management- recommend he remain mindful of overuse, reduce impacts and overuse in boxing

## 2021-12-10 ENCOUNTER — Ambulatory Visit (INDEPENDENT_AMBULATORY_CARE_PROVIDER_SITE_OTHER): Payer: 59 | Admitting: Family Medicine

## 2021-12-10 ENCOUNTER — Encounter: Payer: Self-pay | Admitting: Family Medicine

## 2021-12-10 VITALS — BP 124/84 | HR 64 | Ht 74.0 in | Wt 204.0 lb

## 2021-12-10 DIAGNOSIS — M778 Other enthesopathies, not elsewhere classified: Secondary | ICD-10-CM | POA: Diagnosis not present

## 2021-12-10 DIAGNOSIS — M2392 Unspecified internal derangement of left knee: Secondary | ICD-10-CM

## 2021-12-10 DIAGNOSIS — M2391 Unspecified internal derangement of right knee: Secondary | ICD-10-CM | POA: Diagnosis not present

## 2021-12-10 DIAGNOSIS — M222X1 Patellofemoral disorders, right knee: Secondary | ICD-10-CM | POA: Insufficient documentation

## 2021-12-10 MED ORDER — DICLOFENAC SODIUM 75 MG PO TBEC: 75.0000 mg | DELAYED_RELEASE_TABLET | Freq: Two times a day (BID) | ORAL | 0 refills | Status: DC

## 2021-12-10 NOTE — Progress Notes (Signed)
Primary Care / Sports Medicine Office Visit  Patient Information:  Patient ID: Albert Gonzales, male DOB: 06/21/1990 Age: 31 y.o. MRN: [id number]   Albert Gonzales is a pleasant 31 y.o. male presenting with the following:  Chief Complaint  Patient presents with   Elbow Pain    Left elbow pain for 3 months   Knee Pain    Both, 3 months    Vitals:   12/10/21 1549  BP: 124/84  Pulse: 64  SpO2: 99%   Vitals:   12/10/21 1549  Weight: 204 lb (92.5 kg)  Height: 6\' 2"  (1.88 m)   Body mass index is 26.19 kg/m.  No results found.   Independent interpretation of notes and tests performed by another provider:   None  Procedures performed:   None  Pertinent History, Exam, Impression, and Recommendations:   Problem List Items Addressed This Visit       Musculoskeletal and Integument   Tendinitis of left triceps - Primary    Right-hand-dominant patient presenting with 71-month history of atraumatic left posterior elbow pain in the setting of regular boxing 6 days a week.  Denies any radiation of symptoms, no paresthesias, no swelling or bruising.  Examination shows full range of motion, painful maximal extension and resisted extension, negative provocative testing and no laxity at the elbow.  He is maximally tender at the triceps insertion at the olecranon.  Clinical history and findings are most consistent with triceps tendinopathy, in addition to activity modification he will be transition to diclofenac scheduled twice daily, home-based rehab with a focus on eccentric strengthening, and close follow-up in 4 weeks.  Suboptimal progress can be addressed with activity shutdown, formal PT.  Visit notes from 09/23/2021 and 12/02/2021 reviewed      Relevant Medications   diclofenac (VOLTAREN) 75 MG EC tablet   Internal derangement of left knee    Patient presents with atraumatic roughly 27-month history of bilateral knee pain, localized anteriorly and medially, at the time of  onset he had started boxing routine, 6 days a week. Had swelling and instability at that time which has since resolved, still notes anteromedial pain with prolonged activity, no mechanical catching, locking, buckling.  Examination reveals no abnormalities to inspection, no effusion, range of motion is from 0 to 135 degrees and painless, there is crepitus anteriorly bilaterally during passive flexion/extension, increased patellar translation and laxity noted bilaterally, tenderness along the medial patellar facet and right knee medial joint line, anterior/posterior drawer testing, Lachman's, varus/valgus stressing benign.  He does have positive Thessaly testing bilaterally and equivocal right medial McMurray localization.  History and clinical findings are most consistent with patellofemoral arthralgia, internal derangement involving the medial menisci bilaterally.  Given his otherwise reassuring history, being able to tolerate daily exercises, I have advised a transition to scheduled diclofenac as he is still symptomatic with meloxicam, hinged knee braces, relative rest/activity modification, home-based rehab, and close follow-up in 4 weeks.  I have ordered x-rays for patient to obtain if still symptomatic leading up to follow-up in 4 weeks, recalcitrant symptoms to be addressed with advanced imaging.      Relevant Medications   diclofenac (VOLTAREN) 75 MG EC tablet   Other Relevant Orders   DG Knee Complete 4 Views Left   Internal derangement of right knee    See additional assessment(s) for plan details.      Relevant Medications   diclofenac (VOLTAREN) 75 MG EC tablet   Other Relevant Orders  DG Knee Complete 4 Views Right     Orders & Medications Meds ordered this encounter  Medications   diclofenac (VOLTAREN) 75 MG EC tablet    Sig: Take 1 tablet (75 mg total) by mouth 2 (two) times daily.    Dispense:  60 tablet    Refill:  0   Orders Placed This Encounter  Procedures   DG Knee  Complete 4 Views Left   DG Knee Complete 4 Views Right     Return in about 4 weeks (around 01/07/2022).     Jerrol Banana, MD   Primary Care Sports Medicine [facility]

## 2021-12-10 NOTE — Patient Instructions (Signed)
-   Start diclofenac every a.m. and every p.m. scheduled, take with food - Use knee braces during boxing/training/conditioning, can also use if symptomatic - Okay to continue boxing using elbow and knee symptoms as a guide (do not push through pain) - Start home exercises for the elbow and knees (remove brace for these exercises) and continue these daily until follow-up - Return for follow-up in 4 weeks, contact for any questions/concerns between now and then

## 2021-12-10 NOTE — Assessment & Plan Note (Signed)
See additional assessment(s) for plan details. 

## 2021-12-10 NOTE — Assessment & Plan Note (Signed)
Patient presents with atraumatic roughly 48-month history of bilateral knee pain, localized anteriorly and medially, at the time of onset he had started boxing routine, 6 days a week. Had swelling and instability at that time which has since resolved, still notes anteromedial pain with prolonged activity, no mechanical catching, locking, buckling.  Examination reveals no abnormalities to inspection, no effusion, range of motion is from 0 to 135 degrees and painless, there is crepitus anteriorly bilaterally during passive flexion/extension, increased patellar translation and laxity noted bilaterally, tenderness along the medial patellar facet and right knee medial joint line, anterior/posterior drawer testing, Lachman's, varus/valgus stressing benign.  He does have positive Thessaly testing bilaterally and equivocal right medial McMurray localization.  History and clinical findings are most consistent with patellofemoral arthralgia, internal derangement involving the medial menisci bilaterally.  Given his otherwise reassuring history, being able to tolerate daily exercises, I have advised a transition to scheduled diclofenac as he is still symptomatic with meloxicam, hinged knee braces, relative rest/activity modification, home-based rehab, and close follow-up in 4 weeks.  I have ordered x-rays for patient to obtain if still symptomatic leading up to follow-up in 4 weeks, recalcitrant symptoms to be addressed with advanced imaging.

## 2021-12-10 NOTE — Assessment & Plan Note (Addendum)
Right-hand-dominant patient presenting with 40-month history of atraumatic left posterior elbow pain in the setting of regular boxing 6 days a week.  Denies any radiation of symptoms, no paresthesias, no swelling or bruising.  Examination shows full range of motion, painful maximal extension and resisted extension, negative provocative testing and no laxity at the elbow.  He is maximally tender at the triceps insertion at the olecranon.  Clinical history and findings are most consistent with triceps tendinopathy, in addition to activity modification he will be transition to diclofenac scheduled twice daily, home-based rehab with a focus on eccentric strengthening, and close follow-up in 4 weeks.  Suboptimal progress can be addressed with activity shutdown, formal PT.  Visit notes from 09/23/2021 and 12/02/2021 reviewed

## 2022-01-08 ENCOUNTER — Ambulatory Visit (INDEPENDENT_AMBULATORY_CARE_PROVIDER_SITE_OTHER): Payer: 59 | Admitting: Family Medicine

## 2022-01-08 ENCOUNTER — Encounter: Payer: Self-pay | Admitting: Family Medicine

## 2022-01-08 VITALS — BP 132/82 | HR 67 | Ht 74.0 in | Wt 204.0 lb

## 2022-01-08 DIAGNOSIS — M222X2 Patellofemoral disorders, left knee: Secondary | ICD-10-CM | POA: Diagnosis not present

## 2022-01-08 DIAGNOSIS — M778 Other enthesopathies, not elsewhere classified: Secondary | ICD-10-CM

## 2022-01-08 DIAGNOSIS — M222X1 Patellofemoral disorders, right knee: Secondary | ICD-10-CM | POA: Diagnosis not present

## 2022-01-08 NOTE — Progress Notes (Signed)
     Primary Care / Sports Medicine Office Visit  Patient Information:  Patient ID: Albert Gonzales, male DOB: [birth year] Age: 31 y.o. MRN: [id number]   Albert Gonzales is a pleasant 31 y.o. male presenting with the following:  Chief Complaint  Patient presents with   Tendinitis of left triceps    Vitals:   01/08/22 1526  BP: 132/82  Pulse: 67  SpO2: 98%   Vitals:   01/08/22 1526  Weight: 204 lb (92.5 kg)  Height: 6\' 2"  (1.88 m)   Body mass index is 26.19 kg/m.  No results found.   Independent interpretation of notes and tests performed by another provider:   None  Procedures performed:   None  Pertinent History, Exam, Impression, and Recommendations:   Problem List Items Addressed This Visit       Musculoskeletal and Integument   Tendinitis of left triceps - Primary    Right-hand-dominant patient presents for follow-up to left triceps tendinitis, has been dosing diclofenac once daily scheduled and cyclobenzaprine nightly.  He has been compliant with home exercises and reports improvement.  Examination reveals painless active flexion/extension at the elbow, resisted extension at the left elbow painful, nontender to palpation throughout the triceps tendon.  Given his interval improvement, have discussed discontinuation of medications, reserving use for as needed basis, start of formal physical therapy with focus on eccentric and dry needling techniques.  Follow-up in 6 weeks.      Relevant Orders   Ambulatory referral to Physical Therapy   Patellofemoral arthralgia of both knees    Presents for follow-up to bilateral knee pain, has been dosing diclofenac 1 tablet every a.m. scheduled and cyclobenzaprine nightly scheduled.  Patient reports improvement in symptoms, continues to be active with boxing.  Examination with tenderness along the left lateral patellar facet, quadriceps tendon insertion, otherwise now negative McMurray and Thessaly, single-leg squat on  right elicits pain, left with excessive valgus.  Clinical history and findings are most consistent with bilateral patellofemoral arthralgia secondary to weakness throughout the patellofemoral stabilizers.  Discussed the same with the patient and next steps, plan for physical therapy, transition medications to as needed, and close follow-up in 6 weeks.      Relevant Orders   Ambulatory referral to Physical Therapy    I provided a total time of 30 minutes including both face-to-face and non-face-to-face time on 01/08/2022 inclusive of time utilized for medical chart review, information gathering, care coordination with staff, and documentation completion.   Orders & Medications No orders of the defined types were placed in this encounter.  Orders Placed This Encounter  Procedures   Ambulatory referral to Physical Therapy     Return in about 6 weeks (around 02/19/2022).     Montel Culver, MD   Primary Care Sports Medicine [place]

## 2022-01-08 NOTE — Patient Instructions (Addendum)
-   Transition to diclofenac twice daily as needed for elbow and/or knee pain - Can continue cyclobenzaprine nightly as needed - Start formal physical therapy - Continue with activity as tolerated using symptoms as a guide - Return for follow-up in 6 weeks

## 2022-01-08 NOTE — Assessment & Plan Note (Signed)
Right-hand-dominant patient presents for follow-up to left triceps tendinitis, has been dosing diclofenac once daily scheduled and cyclobenzaprine nightly.  He has been compliant with home exercises and reports improvement.  Examination reveals painless active flexion/extension at the elbow, resisted extension at the left elbow painful, nontender to palpation throughout the triceps tendon.  Given his interval improvement, have discussed discontinuation of medications, reserving use for as needed basis, start of formal physical therapy with focus on eccentric and dry needling techniques.  Follow-up in 6 weeks.

## 2022-01-08 NOTE — Assessment & Plan Note (Signed)
Presents for follow-up to bilateral knee pain, has been dosing diclofenac 1 tablet every a.m. scheduled and cyclobenzaprine nightly scheduled.  Patient reports improvement in symptoms, continues to be active with boxing.  Examination with tenderness along the left lateral patellar facet, quadriceps tendon insertion, otherwise now negative McMurray and Thessaly, single-leg squat on right elicits pain, left with excessive valgus.  Clinical history and findings are most consistent with bilateral patellofemoral arthralgia secondary to weakness throughout the patellofemoral stabilizers.  Discussed the same with the patient and next steps, plan for physical therapy, transition medications to as needed, and close follow-up in 6 weeks.

## 2022-02-19 ENCOUNTER — Ambulatory Visit: Payer: 59 | Admitting: Family Medicine

## 2022-09-08 ENCOUNTER — Other Ambulatory Visit: Payer: Self-pay

## 2022-09-08 ENCOUNTER — Encounter: Payer: Self-pay | Admitting: Emergency Medicine

## 2022-09-08 DIAGNOSIS — R112 Nausea with vomiting, unspecified: Secondary | ICD-10-CM | POA: Insufficient documentation

## 2022-09-08 DIAGNOSIS — R197 Diarrhea, unspecified: Secondary | ICD-10-CM | POA: Insufficient documentation

## 2022-09-08 DIAGNOSIS — R1084 Generalized abdominal pain: Secondary | ICD-10-CM | POA: Insufficient documentation

## 2022-09-08 DIAGNOSIS — Z5321 Procedure and treatment not carried out due to patient leaving prior to being seen by health care provider: Secondary | ICD-10-CM | POA: Insufficient documentation

## 2022-09-08 NOTE — ED Triage Notes (Signed)
Pt to triage via w/c with no distress noted; pt reports generalized abd pain accomp by N/V/D

## 2022-09-09 ENCOUNTER — Emergency Department
Admission: EM | Admit: 2022-09-09 | Discharge: 2022-09-09 | Discharge status: Against Medical Advice | Payer: Self-pay | Attending: Emergency Medicine | Admitting: Emergency Medicine

## 2022-09-09 LAB — COMPREHENSIVE METABOLIC PANEL
ALT: 27 U/L (ref 0–44)
AST: 22 U/L (ref 15–41)
Albumin: 4.5 g/dL (ref 3.5–5.0)
Alkaline Phosphatase: 60 U/L (ref 38–126)
Anion gap: 8 (ref 5–15)
BUN: 21 mg/dL — ABNORMAL HIGH (ref 6–20)
CO2: 21 mmol/L — ABNORMAL LOW (ref 22–32)
Calcium: 9.1 mg/dL (ref 8.9–10.3)
Chloride: 104 mmol/L (ref 98–111)
Creatinine, Ser: 1.25 mg/dL — ABNORMAL HIGH (ref 0.61–1.24)
GFR, Estimated: >60 mL/min (ref >60)
Glucose, Bld: 114 mg/dL — ABNORMAL HIGH (ref 70–99)
Potassium: 3.6 mmol/L (ref 3.5–5.1)
Sodium: 133 mmol/L — ABNORMAL LOW (ref 135–145)
Total Bilirubin: 1.1 mg/dL (ref 0.3–1.2)
Total Protein: 8.1 g/dL (ref 6.5–8.1)

## 2022-09-09 LAB — CBC WITH DIFFERENTIAL/PLATELET
Abs Immature Granulocytes: 0.02 10*3/uL (ref 0.00–0.07)
Basophils Absolute: 0 10*3/uL (ref 0.0–0.1)
Basophils Relative: 0 %
Eosinophils Absolute: 0 10*3/uL (ref 0.0–0.5)
Eosinophils Relative: 0 %
HCT: 46.6 % (ref 39.0–52.0)
Hemoglobin: 15.6 g/dL (ref 13.0–17.0)
Immature Granulocytes: 0 %
Lymphocytes Relative: 6 %
Lymphs Abs: 0.3 10*3/uL — ABNORMAL LOW (ref 0.7–4.0)
MCH: 28.4 pg (ref 26.0–34.0)
MCHC: 33.5 g/dL (ref 30.0–36.0)
MCV: 84.7 fL (ref 80.0–100.0)
Monocytes Absolute: 0.5 10*3/uL (ref 0.1–1.0)
Monocytes Relative: 9 %
Neutro Abs: 4.5 10*3/uL (ref 1.7–7.7)
Neutrophils Relative %: 85 %
Platelets: 257 10*3/uL (ref 150–400)
RBC: 5.5 MIL/uL (ref 4.22–5.81)
RDW: 12.9 % (ref 11.5–15.5)
WBC: 5.3 10*3/uL (ref 4.0–10.5)
nRBC: 0 % (ref 0.0–0.2)

## 2022-09-09 LAB — LIPASE, BLOOD: Lipase: 30 U/L (ref 11–51)

## 2022-09-10 ENCOUNTER — Telehealth: Payer: Self-pay

## 2022-09-10 NOTE — Transitions of Care (Post Inpatient/ED Visit) (Signed)
   09/10/2022  Name: Albert Gonzales MRN: [id number] DOB: 1990/12/25  Today's TOC FU Call Status: Today's TOC FU Call Status:: Successful TOC FU Call Competed TOC FU Call Complete Date: 09/10/22  Transition Care Management Follow-up Telephone Call Date of Discharge: 09/09/22 (Patient left after waiting 3 hours and was not seen.) Discharge Facility: [facility]) Type of Discharge: Emergency Department How have you been since you were released from the hospital?: Better Any questions or concerns?: No  Items Reviewed: Any new allergies since your discharge?: No Dietary orders reviewed?: NA  Medications Reviewed Today: Medications Reviewed Today     Reviewed by Jerl Santos, CMA (Certified Medical Assistant) on 01/08/22 at 1525  Med List Status: <None>   Medication Order Taking? Sig Documenting Provider Last Dose Status Informant  cyclobenzaprine (FLEXERIL) 10 MG tablet [id number] Yes Take 0.5-1 tablets (5-10 mg total) by mouth at bedtime as needed for muscle spasms. Smitty Cords, DO Taking Active   diclofenac (VOLTAREN) 75 MG EC tablet [id number] Yes Take 1 tablet (75 mg total) by mouth 2 (two) times daily. Jerrol Banana, MD Taking Active   loratadine (CLARITIN) 10 MG tablet [id number] Yes Take 1 tablet (10 mg total) by mouth daily. Use for 4-6 weeks then stop, and use as needed or seasonally [place], Netta Neat, DO Taking Active   montelukast (SINGULAIR) 10 MG tablet [id number] Yes Take 1 tablet (10 mg total) by mouth at bedtime. Smitty Cords, DO Taking Active             Home Care and Equipment/Supplies: Were Home Health Services Ordered?: NA Any new equipment or medical supplies ordered?: NA  Functional Questionnaire: Do you need assistance with bathing/showering or dressing?: No Do you need assistance with meal preparation?: No Do you need assistance with eating?: No Do you have difficulty maintaining continence:  No Do you need assistance with getting out of bed/getting out of a chair/moving?: No Do you have difficulty managing or taking your medications?: No  Follow up appointments reviewed: PCP Follow-up appointment confirmed?: NA Specialist Hospital Follow-up appointment confirmed?: NA Do you need transportation to your follow-up appointment?: No Do you understand care options if your condition(s) worsen?: Yes-patient verbalized understanding    Oneal Grout, [facility]) Lovie Macadamia [phone]

## 2022-09-10 NOTE — Transitions of Care (Post Inpatient/ED Visit) (Deleted)
   09/10/2022  Name: Albert Gonzales MRN: [id number] DOB: Oct 22, 1990  {[id number]}

## 2022-11-27 ENCOUNTER — Other Ambulatory Visit: Payer: Self-pay

## 2022-11-27 DIAGNOSIS — Z0000 Encounter for general adult medical examination without abnormal findings: Secondary | ICD-10-CM

## 2022-11-27 DIAGNOSIS — J3089 Other allergic rhinitis: Secondary | ICD-10-CM

## 2022-11-27 DIAGNOSIS — E785 Hyperlipidemia, unspecified: Secondary | ICD-10-CM

## 2022-11-27 DIAGNOSIS — R7989 Other specified abnormal findings of blood chemistry: Secondary | ICD-10-CM

## 2022-11-30 ENCOUNTER — Other Ambulatory Visit: Payer: 59

## 2022-12-01 LAB — COMPREHENSIVE METABOLIC PANEL
AG Ratio: 1.5 (calc) (ref 1.0–2.5)
ALT: 17 U/L (ref 9–46)
AST: 14 U/L (ref 10–40)
Albumin: 4.2 g/dL (ref 3.6–5.1)
Alkaline phosphatase (APISO): 65 U/L (ref 36–130)
BUN: 16 mg/dL (ref 7–25)
CO2: 26 mmol/L (ref 20–32)
Calcium: 9.5 mg/dL (ref 8.6–10.3)
Chloride: 105 mmol/L (ref 98–110)
Creat: 1.08 mg/dL (ref 0.60–1.26)
Globulin: 2.8 g/dL (ref 1.9–3.7)
Glucose, Bld: 96 mg/dL (ref 65–99)
Potassium: 4.3 mmol/L (ref 3.5–5.3)
Sodium: 139 mmol/L (ref 135–146)
Total Bilirubin: 0.4 mg/dL (ref 0.2–1.2)
Total Protein: 7 g/dL (ref 6.1–8.1)

## 2022-12-01 LAB — LIPID PANEL
Cholesterol: 152 mg/dL (ref <200)
HDL: 43 mg/dL (ref >=40)
LDL Cholesterol (Calc): 85 mg/dL
Non-HDL Cholesterol (Calc): 109 mg/dL (ref <130)
Total CHOL/HDL Ratio: 3.5 (calc) (ref <5.0)
Triglycerides: 142 mg/dL (ref <150)

## 2022-12-01 LAB — CBC WITH DIFFERENTIAL/PLATELET
Absolute Monocytes: 499 {cells}/uL (ref 200–950)
Basophils Absolute: 31 {cells}/uL (ref 0–200)
Basophils Relative: 0.8 %
Eosinophils Absolute: 82 {cells}/uL (ref 15–500)
Eosinophils Relative: 2.1 %
HCT: 43.1 % (ref 38.5–50.0)
Hemoglobin: 14.2 g/dL (ref 13.2–17.1)
Lymphs Abs: 1509 {cells}/uL (ref 850–3900)
MCH: 29.1 pg (ref 27.0–33.0)
MCHC: 32.9 g/dL (ref 32.0–36.0)
MCV: 88.3 fL (ref 80.0–100.0)
MPV: 10.8 fL (ref 7.5–12.5)
Monocytes Relative: 12.8 %
Neutro Abs: 1778 cells/uL (ref 1500–7800)
Neutrophils Relative %: 45.6 %
Platelets: 236 10*3/uL (ref 140–400)
RBC: 4.88 10*6/uL (ref 4.20–5.80)
RDW: 14.6 % (ref 11.0–15.0)
Total Lymphocyte: 38.7 %
WBC: 3.9 10*3/uL (ref 3.8–10.8)

## 2022-12-01 LAB — TSH: TSH: 0.74 m[IU]/L (ref 0.40–4.50)

## 2022-12-01 LAB — TESTOSTERONE: Testosterone: 394 ng/dL (ref 250–827)

## 2022-12-07 ENCOUNTER — Other Ambulatory Visit: Payer: Self-pay | Admitting: Family Medicine

## 2022-12-07 ENCOUNTER — Encounter: Payer: Self-pay | Admitting: Family Medicine

## 2022-12-07 ENCOUNTER — Ambulatory Visit (INDEPENDENT_AMBULATORY_CARE_PROVIDER_SITE_OTHER): Payer: 59 | Admitting: Family Medicine

## 2022-12-07 VITALS — BP 123/82 | HR 80 | Ht 74.0 in | Wt 194.0 lb

## 2022-12-07 DIAGNOSIS — Z0000 Encounter for general adult medical examination without abnormal findings: Secondary | ICD-10-CM

## 2022-12-07 DIAGNOSIS — Z23 Encounter for immunization: Secondary | ICD-10-CM | POA: Diagnosis not present

## 2022-12-07 DIAGNOSIS — J3089 Other allergic rhinitis: Secondary | ICD-10-CM | POA: Diagnosis not present

## 2022-12-07 DIAGNOSIS — R7309 Other abnormal glucose: Secondary | ICD-10-CM

## 2022-12-07 DIAGNOSIS — E785 Hyperlipidemia, unspecified: Secondary | ICD-10-CM

## 2022-12-07 MED ORDER — MONTELUKAST SODIUM 10 MG PO TABS
10.0000 mg | ORAL_TABLET | Freq: Every day | ORAL | 3 refills | Status: AC
Start: 2022-12-07 — End: ?

## 2022-12-07 NOTE — Progress Notes (Signed)
Subjective:    Patient ID: Albert Gonzales, male    DOB: 1990/08/14, 32 y.o.   MRN: [id number]  Albert Gonzales is a 32 y.o. male presenting on 12/07/2022 for Annual Exam   HPI  Here for Annual Physical and Lab Review.  Discussed the use of AI scribe software for clinical note transcription with the patient, who gave verbal consent to proceed.   The patient reported no new health issues since the last visit. They have been maintaining an active lifestyle, engaging in boxing three days a week for 45 minutes each session, which includes both strength and cardio exercises.  The patient's recent blood work showed improvements in cholesterol levels, kidney function, and blood sugar levels. The patient's cholesterol levels have significantly decreased over the past two years, which they attribute to their increased physical activity and dietary changes. Their kidney function has returned to normal, and their blood sugar levels have also decreased.       Elevated Creatinine - RESOLVED Normalized. Thought to be improved w/ better hydration and off creatine pills for workout   HYPERLIPIDEMIA: - Reports improved cholesterol result. Total down from 180 to 150s, and LDL from 112 to 85 Lifestyle - Diet: slight improvement in diet, improved overall, leaner meats, less red meat - Exercise: gym 3 x per week about 45 min, mostly strength training and some cardio now doing more cardio, increased boxing training.   Allergies Environmental / Seasonal Re order Singulair On OTC Loratadine   PMH Low Testosterone - normalized Last lab normal Testosterone 394 (11/2022)     Health Maintenance:  Updated Tetanus Tdap and Flu Shot today   No known fam history of cancer.     12/07/2022    3:16 PM 08/30/2020   10:12 AM 09/05/2014   12:00 PM  Depression screen PHQ 2/9  Decreased Interest 0 0 0  Down, Depressed, Hopeless 0 0 0  PHQ - 2 Score 0 0 0  Altered sleeping 0 0   Tired, decreased energy 0 0    Change in appetite 0 0   Feeling bad or failure about yourself  0 0   Trouble concentrating 0 0   Moving slowly or fidgety/restless 0 0   Suicidal thoughts 0 0   PHQ-9 Score 0 0   Difficult doing work/chores Not difficult at all Not difficult at all       12/07/2022    3:16 PM 08/30/2020   10:12 AM  GAD 7 : Generalized Anxiety Score  Nervous, Anxious, on Edge 0 0  Control/stop worrying 0 0  Worry too much - different things 0 0  Trouble relaxing 0 0  Restless 0 0  Easily annoyed or irritable 0 0  Afraid - awful might happen 0 0  Total GAD 7 Score 0 0  Anxiety Difficulty Not difficult at all Not difficult at all      Past Medical History:  Diagnosis Date   Malignant hyperthermia    pt had arm surgery at [facility] med in 2014 and was told that he developed malignant hyperthermia during surgery   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   during humerus surgery at [facility]   Past Surgical History:  Procedure Laterality Date   HUMERUS FRACTURE SURGERY Right 2014   INGUINAL HERNIA REPAIR Left 09/26/2014   Procedure: LEFT INGUINAL HERNIA REPAIR ;  Surgeon: Kieth Brightly, MD;  Location: [facility] ORS;  Service: General;  Laterality: Left;   MANDIBLE SURGERY Left 03/16/2004  Social History   Socioeconomic History   Marital status: Single    Spouse name: Not on file   Number of children: Not on file   Years of education: Not on file   Highest education level: Not on file  Occupational History   Not on file  Tobacco Use   Smoking status: Never   Smokeless tobacco: Never  Substance and Sexual Activity   Alcohol use: Yes    Alcohol/week: 1.0 standard drink of alcohol    Types: 1 Cans of beer per week    Comment: Patient states he may drink a beer once a month   Drug use: No   Sexual activity: Yes    Partners: Female    Birth control/protection: Condom  Other Topics Concern   Not on file  Social History Narrative   Not on file   Social Determinants of Health    Financial Resource Strain: Not on file  Food Insecurity: Not on file  Transportation Needs: Not on file  Physical Activity: Not on file  Stress: Not on file  Social Connections: Not on file  Intimate Partner Violence: Not on file   Family History  Problem Relation Age of Onset   Hyperlipidemia Father    Hyperlipidemia Paternal Grandfather    Current Outpatient Medications on File Prior to Visit  Medication Sig   loratadine (CLARITIN) 10 MG tablet Take 1 tablet (10 mg total) by mouth daily. Use for 4-6 weeks then stop, and use as needed or seasonally   No current facility-administered medications on file prior to visit.    Review of Systems  Constitutional:  Negative for activity change, appetite change, chills, diaphoresis, fatigue and fever.  HENT:  Negative for congestion and hearing loss.   Eyes:  Negative for visual disturbance.  Respiratory:  Negative for cough, chest tightness, shortness of breath and wheezing.   Cardiovascular:  Negative for chest pain, palpitations and leg swelling.  Gastrointestinal:  Negative for abdominal pain, constipation, diarrhea, nausea and vomiting.  Genitourinary:  Negative for dysuria, frequency and hematuria.  Musculoskeletal:  Negative for arthralgias and neck pain.  Skin:  Negative for rash.  Neurological:  Negative for dizziness, weakness, light-headedness, numbness and headaches.  Hematological:  Negative for adenopathy.  Psychiatric/Behavioral:  Negative for behavioral problems, dysphoric mood and sleep disturbance.    Per HPI unless specifically indicated above      Objective:    BP 123/82   Pulse 80   Ht 6\' 2"  (1.88 m)   Wt 194 lb (88 kg)   BMI 24.91 kg/m   Wt Readings from Last 3 Encounters:  12/07/22 194 lb (88 kg)  09/08/22 200 lb (90.7 kg)  01/08/22 204 lb (92.5 kg)    Physical Exam Vitals and nursing note reviewed.  Constitutional:      General: He is not in acute distress.    Appearance: He is well-developed.  He is not diaphoretic.     Comments: Well-appearing, comfortable, cooperative  HENT:     Head: Normocephalic and atraumatic.     Right Ear: Tympanic membrane, ear canal and external ear normal.     Left Ear: Tympanic membrane, ear canal and external ear normal.     Ears:     Comments: Small wax bilateral ears, not impacted Eyes:     General:        Right eye: No discharge.        Left eye: No discharge.     Conjunctiva/sclera: Conjunctivae normal.  Pupils: Pupils are equal, round, and reactive to light.  Neck:     Thyroid: No thyromegaly.     Vascular: No carotid bruit.  Cardiovascular:     Rate and Rhythm: Normal rate and regular rhythm.     Pulses: Normal pulses.     Heart sounds: Normal heart sounds. No murmur heard. Pulmonary:     Effort: Pulmonary effort is normal. No respiratory distress.     Breath sounds: Normal breath sounds. No wheezing or rales.  Abdominal:     General: Bowel sounds are normal. There is no distension.     Palpations: Abdomen is soft. There is no mass.     Tenderness: There is no abdominal tenderness.  Musculoskeletal:        General: No tenderness. Normal range of motion.     Cervical back: Normal range of motion and neck supple.     Right lower leg: No edema.     Left lower leg: No edema.     Comments: Upper / Lower Extremities: - Normal muscle tone, strength bilateral upper extremities 5/5, lower extremities 5/5  Lymphadenopathy:     Cervical: No cervical adenopathy.  Skin:    General: Skin is warm and dry.     Findings: No erythema or rash.  Neurological:     Mental Status: He is alert and oriented to person, place, and time.     Comments: Distal sensation intact to light touch all extremities  Psychiatric:        Mood and Affect: Mood normal.        Behavior: Behavior normal.        Thought Content: Thought content normal.     Comments: Well groomed, good eye contact, normal speech and thoughts      Results for orders placed or  performed in visit on 11/27/22  CBC with Differential/Platelet  Result Value Ref Range   WBC 3.9 3.8 - 10.8 Thousand/uL   RBC 4.88 4.20 - 5.80 Million/uL   Hemoglobin 14.2 13.2 - 17.1 g/dL   HCT 82.9 56.2 - 13.0 %   MCV 88.3 80.0 - 100.0 fL   MCH 29.1 27.0 - 33.0 pg   MCHC 32.9 32.0 - 36.0 g/dL   RDW 86.5 78.4 - 69.6 %   Platelets 236 140 - 400 Thousand/uL   MPV 10.8 7.5 - 12.5 fL   Neutro Abs 1,778 1,500 - 7,800 cells/uL   Lymphs Abs 1,509 850 - 3,900 cells/uL   Absolute Monocytes 499 200 - 950 cells/uL   Eosinophils Absolute 82 15 - 500 cells/uL   Basophils Absolute 31 0 - 200 cells/uL   Neutrophils Relative % 45.6 %   Total Lymphocyte 38.7 %   Monocytes Relative 12.8 %   Eosinophils Relative 2.1 %   Basophils Relative 0.8 %  Comprehensive metabolic panel  Result Value Ref Range   Glucose, Bld 96 65 - 99 mg/dL   BUN 16 7 - 25 mg/dL   Creat 2.95 2.84 - 1.32 mg/dL   BUN/Creatinine Ratio SEE NOTE: 6 - 22 (calc)   Sodium 139 135 - 146 mmol/L   Potassium 4.3 3.5 - 5.3 mmol/L   Chloride 105 98 - 110 mmol/L   CO2 26 20 - 32 mmol/L   Calcium 9.5 8.6 - 10.3 mg/dL   Total Protein 7.0 6.1 - 8.1 g/dL   Albumin 4.2 3.6 - 5.1 g/dL   Globulin 2.8 1.9 - 3.7 g/dL (calc)   AG Ratio 1.5 1.0 - 2.5 (  calc)   Total Bilirubin 0.4 0.2 - 1.2 mg/dL   Alkaline phosphatase (APISO) 65 36 - 130 U/L   AST 14 10 - 40 U/L   ALT 17 9 - 46 U/L  Lipid panel  Result Value Ref Range   Cholesterol 152 <200 mg/dL   HDL 43 > OR = 40 mg/dL   Triglycerides 696 <295 mg/dL   LDL Cholesterol (Calc) 85 mg/dL (calc)   Total CHOL/HDL Ratio 3.5 <5.0 (calc)   Non-HDL Cholesterol (Calc) 109 <130 mg/dL (calc)  TSH  Result Value Ref Range   TSH 0.74 0.40 - 4.50 mIU/L  Testosterone  Result Value Ref Range   Testosterone 394 250 - 827 ng/dL      Assessment & Plan:   Problem List Items Addressed This Visit     Environmental and seasonal allergies   Relevant Medications   montelukast (SINGULAIR) 10 MG tablet    Other Visit Diagnoses     Annual physical exam    -  Primary   Need for influenza vaccination       Relevant Orders   Flu vaccine trivalent PF, 6mos and older(Flulaval,Afluria,Fluarix,Fluzone) (Completed)   Need for Tdap vaccination       Relevant Orders   Tdap vaccine greater than or equal to 7yo IM (Completed)   Dyslipidemia           Updated Health Maintenance information Reviewed recent lab results with patient Encouraged improvement to lifestyle with diet and exercise Goal maintain healthy weight   Assessment and Plan    Insomnia Difficulty falling asleep. Discussed sleep hygiene techniques including limiting screen time before bed, maintaining a cool and quiet sleep environment, and meditation techniques. -Implement sleep hygiene techniques.  Hyperlipidemia Significant improvement in cholesterol levels, LDL 80s, likely secondary to increased physical activity (boxing 3 days/week for 45 minutes) and dietary changes. -Continue current exercise and dietary regimen. No indication for treatment  Testosterone Normal range. No current concerns. -No need for annual monitoring unless specifically requested by patient.  General Health Maintenance Up-to-date on Tdap and flu vaccines. Discussed optional COVID booster. -Consider COVID booster at pharmacy.  Environmental Allergies -Continue Singulair for allergies, refill sent to [facility].    Meds ordered this encounter  Medications   montelukast (SINGULAIR) 10 MG tablet    Sig: Take 1 tablet (10 mg total) by mouth at bedtime.    Dispense:  90 tablet    Refill:  3      Follow up plan: Return in about 1 year (around 12/07/2023) for 1 year fasting lab only then 1 week later Annual Physical.  No testosterone lab next year unless requested Labs ordered 11/30/23 - Check A1c next time  Saralyn Pilar, DO [facility] 12/07/2022, 3:51 PM

## 2022-12-07 NOTE — Patient Instructions (Addendum)
Thank you for coming to the office today.  Flu Shot and TDap vaccine done today  Lab results look good overall, very impressed.  Cholesterol is controlled.  Sugar is normal. We can check A1c for screening diabetes next time.  Testosterone is normal range 394.  No new medications   Refilled the allergy pill Singulair  Sleep Hygiene Recommendations to promote healthy sleep in all patients, especially if symptoms of insomnia are worsening. Due to the nature of sleep rhythms, if your body gets "out of rhythm", it may take some time before your sleep cycle can be "reset".  Please try to follow as many of the following tips as you can, usually there are only a few of these are the primary cause of the problem.  ?To reset your sleep rhythm, go to bed and get up at the same time every day ?Sleep only long enough to feel rested and then get out of bed ?Do not try to force yourself to sleep. If you can't sleep, get out of bed and try again later. ?Avoid naps during the day, unless excessively tired. The more sleeping during the day, then the less sleep your body needs at night.  ?Have coffee, tea, and other foods that have caffeine only in the morning ?Exercise several days a week, but not right before bed ?If you drink alcohol, prefer to have appropriate drink with one meal, but prefer to avoid alcohol in the evening, and bedtime ?If you smoke, avoid smoking, especially in the evening  ?Avoid watching TV or looking at phones, computers, or reading devices ("e-books") that give off light at least 30 minutes before bed. This artificial light sends "awake signals" to your brain and can make it harder to fall asleep. ?Make your bedroom a comfortable place where it is easy to fall asleep: Put up shades or special blackout curtains to block light from outside. Use a white noise machine to block noise. Keep the temperature cool. ?Try your best to solve or at least address your problems before you  go to bed ?Use relaxation techniques to manage stress. Ask your health care provider to suggest some techniques that may work well for you. These may include: Breathing exercises. Routines to release muscle tension. Visualizing peaceful scenes.    DUE for FASTING BLOOD WORK (no food or drink after midnight before the lab appointment, only water or coffee without cream/sugar on the morning of)  SCHEDULE "Lab Only" visit in the morning at the clinic for lab draw in 1 YEAR  - Make sure Lab Only appointment is at about 1 week before your next appointment, so that results will be available  For Lab Results, once available within 2-3 days of blood draw, you can can log in to MyChart online to view your results and a brief explanation. Also, we can discuss results at next follow-up visit.   Please schedule a Follow-up Appointment to: Return in about 1 year (around 12/07/2023) for 1 year fasting lab only then 1 week later Annual Physical.  If you have any other questions or concerns, please feel free to call the office or send a message through MyChart. You may also schedule an earlier appointment if necessary.  Additionally, you may be receiving a survey about your experience at our office within a few days to 1 week by e-mail or mail. We value your feedback.  Saralyn Pilar, DO [facility], [place]

## 2023-11-30 ENCOUNTER — Other Ambulatory Visit: Payer: Self-pay

## 2023-11-30 DIAGNOSIS — Z0000 Encounter for general adult medical examination without abnormal findings: Secondary | ICD-10-CM

## 2023-11-30 DIAGNOSIS — R7309 Other abnormal glucose: Secondary | ICD-10-CM

## 2023-11-30 DIAGNOSIS — E785 Hyperlipidemia, unspecified: Secondary | ICD-10-CM

## 2023-12-01 LAB — COMPLETE METABOLIC PANEL WITHOUT GFR
AG Ratio: 1.6 (calc) (ref 1.0–2.5)
ALT: 19 U/L (ref 9–46)
AST: 17 U/L (ref 10–40)
Albumin: 4.3 g/dL (ref 3.6–5.1)
Alkaline phosphatase (APISO): 65 U/L (ref 36–130)
BUN: 17 mg/dL (ref 7–25)
CO2: 27 mmol/L (ref 20–32)
Calcium: 9.5 mg/dL (ref 8.6–10.3)
Chloride: 105 mmol/L (ref 98–110)
Creat: 1.26 mg/dL (ref 0.60–1.26)
Globulin: 2.7 g/dL (ref 1.9–3.7)
Glucose, Bld: 89 mg/dL (ref 65–99)
Potassium: 4.2 mmol/L (ref 3.5–5.3)
Sodium: 138 mmol/L (ref 135–146)
Total Bilirubin: 0.3 mg/dL (ref 0.2–1.2)
Total Protein: 7 g/dL (ref 6.1–8.1)

## 2023-12-01 LAB — HEMOGLOBIN A1C
Hgb A1c MFr Bld: 6.1 % — ABNORMAL HIGH
Mean Plasma Glucose: 128 mg/dL
eAG (mmol/L): 7.1 mmol/L

## 2023-12-01 LAB — LIPID PANEL
Cholesterol: 169 mg/dL (ref <200)
HDL: 38 mg/dL — ABNORMAL LOW (ref >=40)
LDL Cholesterol (Calc): 99 mg/dL
Non-HDL Cholesterol (Calc): 131 mg/dL — ABNORMAL HIGH (ref <130)
Total CHOL/HDL Ratio: 4.4 (calc) (ref <5.0)
Triglycerides: 201 mg/dL — ABNORMAL HIGH (ref <150)

## 2023-12-01 LAB — CBC WITH DIFFERENTIAL/PLATELET
Absolute Lymphocytes: 1849 {cells}/uL (ref 850–3900)
Absolute Monocytes: 593 {cells}/uL (ref 200–950)
Basophils Absolute: 0 {cells}/uL (ref 0–200)
Basophils Relative: 0 %
Eosinophils Absolute: 92 {cells}/uL (ref 15–500)
Eosinophils Relative: 2 %
HCT: 43.2 % (ref 38.5–50.0)
Hemoglobin: 14.6 g/dL (ref 13.2–17.1)
MCH: 29.7 pg (ref 27.0–33.0)
MCHC: 33.8 g/dL (ref 32.0–36.0)
MCV: 87.8 fL (ref 80.0–100.0)
MPV: 11.6 fL (ref 7.5–12.5)
Monocytes Relative: 12.9 %
Neutro Abs: 2065 {cells}/uL (ref 1500–7800)
Neutrophils Relative %: 44.9 %
Platelets: 242 Thousand/uL (ref 140–400)
RBC: 4.92 Million/uL (ref 4.20–5.80)
RDW: 13.3 % (ref 11.0–15.0)
Total Lymphocyte: 40.2 %
WBC: 4.6 Thousand/uL (ref 3.8–10.8)

## 2023-12-01 LAB — TSH: TSH: 0.54 m[IU]/L (ref 0.40–4.50)

## 2023-12-02 ENCOUNTER — Ambulatory Visit: Payer: Self-pay | Admitting: Family Medicine

## 2023-12-07 ENCOUNTER — Ambulatory Visit (INDEPENDENT_AMBULATORY_CARE_PROVIDER_SITE_OTHER): Payer: Self-pay | Admitting: Family Medicine

## 2023-12-07 ENCOUNTER — Encounter: Payer: Self-pay | Admitting: Family Medicine

## 2023-12-07 VITALS — BP 128/82 | HR 70 | Ht 74.0 in | Wt 208.1 lb

## 2023-12-07 DIAGNOSIS — Z23 Encounter for immunization: Secondary | ICD-10-CM | POA: Diagnosis not present

## 2023-12-07 DIAGNOSIS — J3089 Other allergic rhinitis: Secondary | ICD-10-CM | POA: Diagnosis not present

## 2023-12-07 DIAGNOSIS — Z0000 Encounter for general adult medical examination without abnormal findings: Secondary | ICD-10-CM | POA: Diagnosis not present

## 2023-12-07 NOTE — Progress Notes (Signed)
 Subjective:    Patient ID: Albert Gonzales, male    DOB: 1990/11/22, 33 y.o.   MRN: [id number]  Albert Gonzales is a 33 y.o. male presenting on 12/07/2023 for Annual Exam   HPI  Discussed the use of AI scribe software for clinical note transcription with the patient, who gave verbal consent to proceed.  History of Present Illness   Albert Gonzales is a 33 year old male with prediabetes who presents for an annual physical exam and follow-up on elevated blood sugar levels.  Pre-Diabetes, new diagnosis Hyperglycemia and weight gain - Elevated blood glucose with most recent A1c of 6.1% - Decreased exercise routine due to increased work hours - Less adherence to dietary recommendations - Weight increased from 194 lbs to 208 lbs over the past year - Father with history of diabetes  Environmental Allergy Allergies managed with Singulair  every evening and over-the-counter Claritin   Sleep disturbance - Disrupted sleep with frequent early morning awakenings, typically about an hour before intended wake time - Sleep schedule: bedtime around 9-10 PM, wake time 6-6:30 AM - Sleep disruption attributed to changes in work schedule  - No significant mood or stress issues beyond normal work-related stress      Elevated Creatinine - stable Last lab Cr 1.26 similar to prior range   HYPERLIPIDEMIA: Lipids show LDL 99, TG 201 See above   PMH Low Testosterone  - normalized Last lab normal Testosterone  394 (11/2022)     Health Maintenance:   Flu Shot today   No known fam history of cancer.     12/07/2023    3:47 PM 12/07/2022    3:16 PM 08/30/2020   10:12 AM  Depression screen PHQ 2/9  Decreased Interest 0 0 0  Down, Depressed, Hopeless 0 0 0  PHQ - 2 Score 0 0 0  Altered sleeping 0 0 0  Tired, decreased energy 1 0 0  Change in appetite 1 0 0  Feeling bad or failure about yourself  0 0 0  Trouble concentrating 0 0 0  Moving slowly or fidgety/restless 0 0 0  Suicidal  thoughts 0 0 0  PHQ-9 Score 2 0 0  Difficult doing work/chores Not difficult at all Not difficult at all Not difficult at all       12/07/2023    3:47 PM 12/07/2022    3:16 PM 08/30/2020   10:12 AM  GAD 7 : Generalized Anxiety Score  Nervous, Anxious, on Edge 0 0 0  Control/stop worrying 0 0 0  Worry too much - different things 0 0 0  Trouble relaxing 1 0 0  Restless 1 0 0  Easily annoyed or irritable 0 0 0  Afraid - awful might happen 0 0 0  Total GAD 7 Score 2 0 0  Anxiety Difficulty Not difficult at all Not difficult at all Not difficult at all     Past Medical History:  Diagnosis Date   Malignant hyperthermia    pt had arm surgery at [facility] in 2014 and was told that he developed malignant hyperthermia during surgery   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   during humerus surgery at [facility]   Past Surgical History:  Procedure Laterality Date   HUMERUS FRACTURE SURGERY Right 2014   INGUINAL HERNIA REPAIR Left 09/26/2014   Procedure: LEFT INGUINAL HERNIA REPAIR ;  Surgeon: Louanne KANDICE Muse, MD;  Location: [facility] ORS;  Service: General;  Laterality: Left;   MANDIBLE SURGERY Left 03/16/2004  Social History   Socioeconomic History   Marital status: Single    Spouse name: Not on file   Number of children: Not on file   Years of education: Not on file   Highest education level: Associate degree: occupational, Scientist, product/process development, or vocational program  Occupational History   Not on file  Tobacco Use   Smoking status: Never   Smokeless tobacco: Never  Substance and Sexual Activity   Alcohol use: Yes    Alcohol/week: 1.0 standard drink of alcohol    Types: 1 Cans of beer per week    Comment: Patient states he may drink a beer once a month   Drug use: No   Sexual activity: Yes    Partners: Female    Birth control/protection: Condom  Other Topics Concern   Not on file  Social History Narrative   Not on file   Social Drivers of Health   Financial Resource  Strain: Low Risk  (11/30/2023)   Overall Financial Resource Strain (CARDIA)    Difficulty of Paying Living Expenses: Not very hard  Food Insecurity: No Food Insecurity (11/30/2023)   Hunger Vital Sign    Worried About Running Out of Food in the Last Year: Never true    Ran Out of Food in the Last Year: Never true  Transportation Needs: No Transportation Needs (11/30/2023)   PRAPARE - Administrator, Civil Service (Medical): No    Lack of Transportation (Non-Medical): No  Physical Activity: Sufficiently Active (11/30/2023)   Exercise Vital Sign    Days of Exercise per Week: 3 days    Minutes of Exercise per Session: 60 min  Stress: Stress Concern Present (11/30/2023)   [employer] of Occupational Health - Occupational Stress Questionnaire    Feeling of Stress: Rather much  Social Connections: Socially Isolated (11/30/2023)   Social Connection and Isolation Panel    Frequency of Communication with Friends and Family: Twice a week    Frequency of Social Gatherings with Friends and Family: Once a week    Attends Religious Services: Never    Database administrator or Organizations: No    Attends Engineer, structural: Not on file    Marital Status: Never married  Catering manager Violence: Not on file   Family History  Problem Relation Age of Onset   Hyperlipidemia Father    Hyperlipidemia Paternal Grandfather    Current Outpatient Medications on File Prior to Visit  Medication Sig   loratadine  (CLARITIN ) 10 MG tablet Take 1 tablet (10 mg total) by mouth daily. Use for 4-6 weeks then stop, and use as needed or seasonally   montelukast  (SINGULAIR ) 10 MG tablet Take 1 tablet (10 mg total) by mouth at bedtime.   No current facility-administered medications on file prior to visit.    Review of Systems  Constitutional:  Negative for activity change, appetite change, chills, diaphoresis, fatigue and fever.  HENT:  Negative for congestion and hearing loss.   Eyes:   Negative for visual disturbance.  Respiratory:  Negative for cough, chest tightness, shortness of breath and wheezing.   Cardiovascular:  Negative for chest pain, palpitations and leg swelling.  Gastrointestinal:  Negative for abdominal pain, constipation, diarrhea, nausea and vomiting.  Genitourinary:  Negative for dysuria, frequency and hematuria.  Musculoskeletal:  Negative for arthralgias and neck pain.  Skin:  Negative for rash.  Neurological:  Negative for dizziness, weakness, light-headedness, numbness and headaches.  Hematological:  Negative for adenopathy.  Psychiatric/Behavioral:  Negative  for behavioral problems, dysphoric mood and sleep disturbance.    Per HPI unless specifically indicated above     Objective:    BP 128/82 (BP Location: Left Arm, Patient Position: Sitting, Cuff Size: Normal)   Pulse 70   Ht 6' 2 (1.88 m)   Wt 208 lb 2 oz (94.4 kg)   SpO2 96%   BMI 26.72 kg/m   Wt Readings from Last 3 Encounters:  12/07/23 208 lb 2 oz (94.4 kg)  12/07/22 194 lb (88 kg)  09/08/22 200 lb (90.7 kg)    Physical Exam Vitals and nursing note reviewed.  Constitutional:      General: He is not in acute distress.    Appearance: He is well-developed. He is not diaphoretic.     Comments: Well-appearing, comfortable, cooperative  HENT:     Head: Normocephalic and atraumatic.  Eyes:     General:        Right eye: No discharge.        Left eye: No discharge.     Conjunctiva/sclera: Conjunctivae normal.     Pupils: Pupils are equal, round, and reactive to light.  Neck:     Thyroid: No thyromegaly.  Cardiovascular:     Rate and Rhythm: Normal rate and regular rhythm.     Pulses: Normal pulses.     Heart sounds: Normal heart sounds. No murmur heard. Pulmonary:     Effort: Pulmonary effort is normal. No respiratory distress.     Breath sounds: Normal breath sounds. No wheezing or rales.  Abdominal:     General: Bowel sounds are normal. There is no distension.      Palpations: Abdomen is soft. There is no mass.     Tenderness: There is no abdominal tenderness.  Musculoskeletal:        General: No tenderness. Normal range of motion.     Cervical back: Normal range of motion and neck supple.     Comments: Upper / Lower Extremities: - Normal muscle tone, strength bilateral upper extremities 5/5, lower extremities 5/5  Lymphadenopathy:     Cervical: No cervical adenopathy.  Skin:    General: Skin is warm and dry.     Findings: No erythema or rash.  Neurological:     Mental Status: He is alert and oriented to person, place, and time.     Comments: Distal sensation intact to light touch all extremities  Psychiatric:        Mood and Affect: Mood normal.        Behavior: Behavior normal.        Thought Content: Thought content normal.     Comments: Well groomed, good eye contact, normal speech and thoughts     Results for orders placed or performed in visit on 11/30/23  TSH   Collection Time: 11/30/23  3:29 PM  Result Value Ref Range   TSH 0.54 0.40 - 4.50 mIU/L  CBC with Differential/Platelet   Collection Time: 11/30/23  3:29 PM  Result Value Ref Range   WBC 4.6 3.8 - 10.8 Thousand/uL   RBC 4.92 4.20 - 5.80 Million/uL   Hemoglobin 14.6 13.2 - 17.1 g/dL   HCT 56.7 61.4 - 49.9 %   MCV 87.8 80.0 - 100.0 fL   MCH 29.7 27.0 - 33.0 pg   MCHC 33.8 32.0 - 36.0 g/dL   RDW 86.6 88.9 - 84.9 %   Platelets 242 140 - 400 Thousand/uL   MPV 11.6 7.5 - 12.5 fL   Neutro  Abs 2,065 1,500 - 7,800 cells/uL   Absolute Lymphocytes 1,849 850 - 3,900 cells/uL   Absolute Monocytes 593 200 - 950 cells/uL   Eosinophils Absolute 92 15 - 500 cells/uL   Basophils Absolute 0 0 - 200 cells/uL   Neutrophils Relative % 44.9 %   Total Lymphocyte 40.2 %   Monocytes Relative 12.9 %   Eosinophils Relative 2.0 %   Basophils Relative 0.0 %  COMPLETE METABOLIC PANEL WITH GFR   Collection Time: 11/30/23  3:29 PM  Result Value Ref Range   Glucose, Bld 89 65 - 99 mg/dL   BUN  17 7 - 25 mg/dL   Creat 8.73 9.39 - 8.73 mg/dL   BUN/Creatinine Ratio SEE NOTE: 6 - 22 (calc)   Sodium 138 135 - 146 mmol/L   Potassium 4.2 3.5 - 5.3 mmol/L   Chloride 105 98 - 110 mmol/L   CO2 27 20 - 32 mmol/L   Calcium 9.5 8.6 - 10.3 mg/dL   Total Protein 7.0 6.1 - 8.1 g/dL   Albumin 4.3 3.6 - 5.1 g/dL   Globulin 2.7 1.9 - 3.7 g/dL (calc)   AG Ratio 1.6 1.0 - 2.5 (calc)   Total Bilirubin 0.3 0.2 - 1.2 mg/dL   Alkaline phosphatase (APISO) 65 36 - 130 U/L   AST 17 10 - 40 U/L   ALT 19 9 - 46 U/L  Lipid panel   Collection Time: 11/30/23  3:29 PM  Result Value Ref Range   Cholesterol 169 <200 mg/dL   HDL 38 (L) > OR = 40 mg/dL   Triglycerides 798 (H) <150 mg/dL   LDL Cholesterol (Calc) 99 mg/dL (calc)   Total CHOL/HDL Ratio 4.4 <5.0 (calc)   Non-HDL Cholesterol (Calc) 131 (H) <130 mg/dL (calc)  Hemoglobin J8r   Collection Time: 11/30/23  3:29 PM  Result Value Ref Range   Hgb A1c MFr Bld 6.1 (H) <5.7 %   Mean Plasma Glucose 128 mg/dL   eAG (mmol/L) 7.1 mmol/L      Assessment & Plan:   Problem List Items Addressed This Visit     Environmental and seasonal allergies   Other Visit Diagnoses       Annual physical exam    -  Primary     Flu vaccine need       Relevant Orders   Flu vaccine trivalent PF, 6mos and older(Flulaval,Afluria,Fluarix,Fluzone) (Completed)        Updated Health Maintenance information Reviewed recent lab results with patient Encouraged improvement to lifestyle with diet and exercise Goal of weight loss   Prediabetes A1c is 6.1%, indicating prediabetes. New diagnosis - Implement low carb, low starch, low sugar diet. - Increase physical activity, focusing on cardio. - Consider using a food tracker app. - Reduce intake of energy drinks, particularly Red Bull. - Consider sugar-free beverage options. As temporary option. - Schedule A1c check in one year, or sooner if lifestyle changes are made.  Hyperlipidemia LDL cholesterol is 99 mg/dL,  within target range. Triglycerides expected to improve with dietary changes for prediabetes. - Continue monitoring cholesterol levels annually.  Seasonal environmental allergies  Loratadine  OTC Uses Singulair  as needed, primarily during allergy flare-ups. Continue Singulair  as needed. Did not re order yet, he will notify  Abnormal sleep rhythm due to shift work Disrupted sleep pattern likely due to shift work affecting circadian rhythm. No signs of sleep apnea or other medical causes. - Consider using melatonin, 2 to 5mg  over the counter, to help reset sleep rhythm.  Orders Placed This Encounter  Procedures   Flu vaccine trivalent PF, 6mos and older(Flulaval,Afluria,Fluarix,Fluzone)    No orders of the defined types were placed in this encounter.    Follow up plan: Return for 1 year fasting lab > 1 week later Annual Physical.  Marsa Officer, DO [facility] 12/07/2023, 4:08 PM

## 2023-12-07 NOTE — Patient Instructions (Addendum)
 Thank you for coming to the office today.  Recent Labs    11/30/23 1529  HGBA1C 6.1*   Try to limit the red bulls energy drink options, okay to do sugar free for energy drinks but limit this in future.  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  If you have any other questions or concerns, please feel free to call the office or send a message through MyChart. You may also schedule an earlier appointment if necessary.  Additionally, you may be receiving a survey about your experience at our office within a few days to 1 week by e-mail or mail. We value your feedback.  Marsa Officer, DO [facility], [place]

## 2024-12-07 ENCOUNTER — Other Ambulatory Visit

## 2024-12-13 ENCOUNTER — Encounter: Admitting: Family Medicine
